# Patient Record
Sex: Male | Born: 1940 | Race: Black or African American | Hispanic: No | Smoking: Never smoker
Health system: Southern US, Community
[De-identification: ages and names within clinical notes are randomized; demographics above are authoritative.]

## PROBLEM LIST (undated history)

## (undated) DIAGNOSIS — N189 Chronic kidney disease, unspecified: Secondary | ICD-10-CM

## (undated) DIAGNOSIS — K3580 Unspecified acute appendicitis: Secondary | ICD-10-CM

## (undated) DIAGNOSIS — K648 Other hemorrhoids: Secondary | ICD-10-CM

## (undated) DIAGNOSIS — K4090 Unilateral inguinal hernia, without obstruction or gangrene, not specified as recurrent: Secondary | ICD-10-CM

## (undated) DIAGNOSIS — C61 Malignant neoplasm of prostate: Secondary | ICD-10-CM

## (undated) DIAGNOSIS — N210 Calculus in bladder: Secondary | ICD-10-CM

## (undated) DIAGNOSIS — I10 Essential (primary) hypertension: Secondary | ICD-10-CM

## (undated) DIAGNOSIS — K5792 Diverticulitis of intestine, part unspecified, without perforation or abscess without bleeding: Secondary | ICD-10-CM

## (undated) DIAGNOSIS — K603 Anal fistula, unspecified: Secondary | ICD-10-CM

## (undated) HISTORY — PX: APPENDECTOMY: SHX54

## (undated) HISTORY — DX: Other hemorrhoids: K64.8

## (undated) HISTORY — DX: Anal fistula, unspecified: K60.30

## (undated) HISTORY — DX: Chronic kidney disease, unspecified: N18.9

## (undated) HISTORY — DX: Anal fistula: K60.3

## (undated) HISTORY — PX: OTHER SURGICAL HISTORY: SHX169

## (undated) HISTORY — DX: Unilateral inguinal hernia, without obstruction or gangrene, not specified as recurrent: K40.90

## (undated) HISTORY — DX: Diverticulitis of intestine, part unspecified, without perforation or abscess without bleeding: K57.92

## (undated) HISTORY — DX: Malignant neoplasm of prostate: C61

## (undated) HISTORY — DX: Unspecified acute appendicitis: K35.80

---

## 2005-02-12 ENCOUNTER — Ambulatory Visit: Payer: Self-pay

## 2007-08-18 ENCOUNTER — Ambulatory Visit: Payer: Self-pay | Admitting: Urology

## 2009-02-14 ENCOUNTER — Ambulatory Visit: Payer: Self-pay | Admitting: Urology

## 2009-02-22 ENCOUNTER — Inpatient Hospital Stay: Payer: Self-pay | Admitting: Surgery

## 2009-03-22 ENCOUNTER — Ambulatory Visit: Payer: Self-pay | Admitting: Surgery

## 2009-05-30 ENCOUNTER — Ambulatory Visit: Payer: Self-pay | Admitting: Gastroenterology

## 2009-08-02 ENCOUNTER — Ambulatory Visit: Payer: Self-pay | Admitting: Surgery

## 2009-08-08 ENCOUNTER — Ambulatory Visit: Payer: Self-pay | Admitting: Surgery

## 2009-08-08 HISTORY — PX: RECTAL SURGERY: SHX760

## 2011-07-03 ENCOUNTER — Inpatient Hospital Stay: Payer: Self-pay | Admitting: Surgery

## 2011-07-03 HISTORY — PX: LAPAROSCOPIC APPENDECTOMY: SHX408

## 2011-07-04 LAB — PATHOLOGY REPORT

## 2011-11-06 HISTORY — PX: INGUINAL HERNIA REPAIR: SUR1180

## 2012-01-28 ENCOUNTER — Ambulatory Visit: Payer: Self-pay | Admitting: Surgery

## 2012-01-28 DIAGNOSIS — I10 Essential (primary) hypertension: Secondary | ICD-10-CM

## 2012-01-28 LAB — BASIC METABOLIC PANEL
Anion Gap: 7 (ref 7–16)
Chloride: 104 mmol/L (ref 98–107)
Co2: 31 mmol/L (ref 21–32)
Creatinine: 1.11 mg/dL (ref 0.60–1.30)
EGFR (African American): >60
Osmolality: 285 (ref 275–301)
Sodium: 142 mmol/L (ref 136–145)

## 2012-01-28 LAB — CBC WITH DIFFERENTIAL/PLATELET
Basophil #: 0 10*3/uL (ref 0.0–0.1)
Basophil %: 0.6 %
HCT: 46.6 % (ref 40.0–52.0)
HGB: 15.7 g/dL (ref 13.0–18.0)
Lymphocyte #: 1.5 10*3/uL (ref 1.0–3.6)
Lymphocyte %: 20.6 %
MCH: 29.7 pg (ref 26.0–34.0)
RDW: 13 % (ref 11.5–14.5)
WBC: 7.4 10*3/uL (ref 3.8–10.6)

## 2012-02-04 ENCOUNTER — Ambulatory Visit: Payer: Self-pay | Admitting: Surgery

## 2012-02-04 HISTORY — PX: HERNIA REPAIR: SHX51

## 2014-08-23 DIAGNOSIS — D126 Benign neoplasm of colon, unspecified: Secondary | ICD-10-CM | POA: Insufficient documentation

## 2014-09-06 ENCOUNTER — Ambulatory Visit: Payer: Self-pay | Admitting: Gastroenterology

## 2015-01-04 ENCOUNTER — Ambulatory Visit: Payer: Self-pay

## 2015-02-27 NOTE — H&P (Signed)
PATIENT NAMELEONTAE, [name] MR#:  [id number] DATE OF BIRTH:  06-14-1941  DATE OF ADMISSION:  02/04/2012  CHIEF COMPLAINT: Right inguinal hernia.   HISTORY OF PRESENT ILLNESS: This is a patient with a right inguinal hernia. He has pain and a bulge. It is requiring surgery for repair. He has been seen in the office recently.   PAST MEDICAL HISTORY:  1. Hypertension. 2. Chronic renal failure.  3. Hypercholesterolemia.  4. Ruptured appendix with autolysis.   PAST SURGICAL HISTORY:  1. Anal fissure. 2. Diagnostic laparoscopy with drainage of abscesses (appendix was never removed, it was autolyzed).   ALLERGIES: Clindamycin.   MEDICATIONS: Hydrochlorothiazide.   FAMILY HISTORY: Noncontributory.   SOCIAL HISTORY: Patient does not smoke or drink. He is semiretired.   REVIEW OF SYSTEMS: 10 system review has been performed and negative with the exception of that mentioned in the history of present illness.   PHYSICAL EXAMINATION:  GENERAL: Healthy-appearing black male patient.   HEENT: No scleral icterus.   NECK: No palpable neck nodes.   CHEST: Clear to auscultation.   CARDIAC: Regular rate and rhythm.   ABDOMEN: Soft, nontender. Multiple scars are noted from prior laparoscopy.   EXTREMITIES: Without edema.   NEUROLOGIC: Grossly intact.   INTEGUMENT: No jaundice.   GENITOURINARY: Right inguinal hernia with normal testicles.   LABORATORY, DIAGNOSTIC AND RADIOLOGICAL DATA: Most recent lab reports are within normal limits including hemoglobin and hematocrit of 15.7 and 47.   ASSESSMENT AND PLAN: This is a patient with a right inguinal hernia. He is here for elective repair using laparoscopic preperitoneal approach. We have discussed with him the options of observation versus an open procedure versus a laparoscopic approach which he has chosen. The risks of bleeding, infection, inability to proceed with a laparoscope, recurrence, ischemic orchitis, chronic pain syndrome, and  neuroma have all been discussed with him. He understood and agreed to proceed.   ____________________________ Jerrol Banana Burt Knack, MD rec:cms D: 02/03/2012 20:29:47 ET T: 02/04/2012 06:32:23 ET JOB#: [id number]  cc: Jerrol Banana. Burt Knack, MD, <Dictator>  Florene Glen MD ELECTRONICALLY SIGNED 02/05/2012 9:55

## 2015-02-27 NOTE — Op Note (Signed)
PATIENT NAME:  [name], [name] MR#:  [id number] DATE OF BIRTH:  [birth year]  DATE OF PROCEDURE:  02/04/2012  PREOPERATIVE DIAGNOSIS: Symptomatic right inguinal hernia.   POSTOPERATIVE DIAGNOSIS: Symptomatic right inguinal hernia.   PROCEDURE: Laparoscopic preperitoneal repair of a right inguinal hernia.   SURGEON: Phoebe Perch, M.D.   ANESTHESIA: General with endotracheal tube.   INDICATIONS: This is a patient with a right inguinal hernia requiring repair. Preoperatively we discussed rationale for surgery, the options of observation, risk of bleeding, infection, recurrence, ischemic orchitis, chronic pain syndrome, and neuroma. This was all reviewed for him in the preop holding area. He understood and agreed to proceed.   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   DESCRIPTION OF PROCEDURE: The patient was induced to general anesthesia, IV antibiotics were given, a Foley catheter was placed, and then he was prepped and draped in a sterile fashion. Marcaine was infiltrated into the skin and subcutaneous tissues around the periumbilical area. An incision was made. Dissection down to the right rectus fascia was performed and it was incised and the muscle retracted laterally. The [place] surgical dissecting balloon was placed followed by the [place] surgical structural balloon, the preperitoneal space was insufflated, and under direct vision two midline 5 mm ports were placed. Nilsa Macht's ligament was delineated. The lateral extent of dissection was determined. At this point, the provided 5 mm sheaths were not staying within the abdominal space; therefore, they were switched out to applied medical ports.   The lateral extent of dissection was again determined. The cord was skeletonized of a large indirect sac, which was retracted cephalad, as was the cord lipoma. The cord lipoma was not removed and into the preperitoneal space was placed a 4 x 6 trimmed and laterally scissored  Atrium mesh, which was placed into the preperitoneal space and held in with the [place] surgical tacker avoiding the area of the nerve. The preperitoneal space was then desufflated under direct vision. There was no sign of rent in the peritoneum and no sign of bowel coming into contact with mesh.   The ports were all removed and then the fascial edges were approximated with 0 Vicryl figure-of-eight sutures. 4-0 subcuticular Monocryl was used at all skin edges. Steri-Strips, Mastisol, and sterile dressings were placed.   The patient tolerated the procedure well. There were no complications. He was taken to the recovery room in stable condition to be discharged in the care of his family with followup in 10 days. ____________________________ Jerrol Banana Burt Knack, MD rec:slb D: 02/04/2012 09:50:44 ET T: 02/04/2012 11:21:03 ET JOB#: [id number]  cc: Jerrol Banana. Burt Knack, MD, <Dictator> Florene Glen MD ELECTRONICALLY SIGNED 02/05/2012 9:55

## 2015-02-28 LAB — SURGICAL PATHOLOGY

## 2015-05-24 ENCOUNTER — Encounter: Payer: Self-pay | Admitting: Surgery

## 2015-05-24 ENCOUNTER — Ambulatory Visit (INDEPENDENT_AMBULATORY_CARE_PROVIDER_SITE_OTHER): Payer: Medicare Other | Admitting: Surgery

## 2015-05-24 ENCOUNTER — Encounter (INDEPENDENT_AMBULATORY_CARE_PROVIDER_SITE_OTHER): Payer: Self-pay

## 2015-05-24 VITALS — BP 144/74 | HR 80 | Temp 97.9°F | Ht 68.0 in | Wt 175.0 lb

## 2015-05-24 DIAGNOSIS — K439 Ventral hernia without obstruction or gangrene: Secondary | ICD-10-CM

## 2015-05-24 HISTORY — DX: Ventral hernia without obstruction or gangrene: K43.9

## 2015-05-24 NOTE — Progress Notes (Signed)
Patient ID: [name], male   DOB: 1940/12/08, 74 y.o.   MRN: [id number]  Chief Complaint  Patient presents with  . Umbilical Hernia     x 3 years, nonsymptomatic    HPI   [name] is a 73 y.o. male.  With a prior history of a laparoscopic right inguinal herniorrhaphy with mesh. 2 years ago he noted an umbilical bulge which is constant and very sporadic discomfort. He does not think that it is getting bigger. He self-referred himself to the office to have this evaluated. The patient denies any nausea vomiting or any recurrence in the right groin. Denies any right groin pain.  HPI  Past Medical History  Diagnosis Date  . Hemorrhoids, internal   . Acute appendicitis     perforated  . Inguinal hernia   . Diverticulitis   . Anal fistula   . Chronic kidney disease     Past Surgical History  Procedure Laterality Date  . Laparoscopic appendectomy  07/03/2011    Dr Burt Knack  . Hernia repair  02/04/2012    Dr. Burt Knack  . Rectal surgery  08/08/2009    Dr. Pat Patrick  . Colonoscopy 2010    . Inguinal hernia repair  2013    Dr. Burt Knack    Family History  Problem Relation Age of Onset  . Hypertension Mother   . Cancer Father   . Hypertension Father     Social History History  Substance Use Topics  . Smoking status: Never Smoker   . Smokeless tobacco: Never Used  . Alcohol Use: No    Allergies  Allergen Reactions  . Clindamycin/Lincomycin Rash    Tachycardia     Current Outpatient Prescriptions  Medication Sig Dispense Refill  . dutasteride (AVODART) 0.5 MG capsule Take 1 tablet by mouth at bedtime.    . hydrochlorothiazide (HYDRODIURIL) 25 MG tablet Take 1 tablet by mouth at bedtime.  2  . lovastatin (MEVACOR) 40 MG tablet Take 80 mg by mouth at bedtime.      No current facility-administered medications for this visit.      Review of Systems A 10 point review of systems was asked and was negative except for the following positive findings.  Blood pressure  144/74, pulse 80, temperature 97.9 F (36.6 C), temperature source Oral, height 5\' 8"  (1.727 m), weight 175 lb (79.379 kg).  Physical Exam CONSTITUTIONAL:  Pleasant, well-developed, well-nourished, and in no acute distress. EYES: Pupils equal and reactive to light, Sclera non-icteric EARS, NOSE, MOUTH AND THROAT:  The oropharynx was clear.  Dentition is good repair.  Oral mucosa pink and moist. LYMPH NODES:  Lymph nodes in the neck and axillae were normal RESPIRATORY:  Lungs were clear.  Normal respiratory effort without pathologic use of accessory muscles of respiration CARDIOVASCULAR: Heart was regular without murmurs.  There were no carotid bruits. GI: The abdomen was soft, nontender, and nondistended. A small reducible supraumbilical hernia is noted. There is a scar consistent with prior TEP hernia repair. GU:  Rectal deferred.   MUSCULOSKELETAL:  Normal muscle strength and tone.  No clubbing or cyanosis.   SKIN:  There were no pathologic skin lesions.  There were no nodules on palpation. NEUROLOGIC:  Sensation is normal.  Cranial nerves are grossly intact. PSYCH:  Oriented to person, place and time.  Mood and affect are normal.  Assessment    Minimally symptomatic umbilical hernia.    Plan    At present I do not think repair is indicated. The  patient is in agreement. I will see him back in the office as needed.       Sherri Rad 05/24/2015, 9:56 AM

## 2015-05-24 NOTE — Patient Instructions (Signed)
Follow-up in our office as needed.   Please call with any questions or concerns.  Call if you begin to have symptoms more frequently or if they become much worse.

## 2015-06-06 ENCOUNTER — Telehealth: Payer: Self-pay | Admitting: Urology

## 2015-06-06 NOTE — Telephone Encounter (Signed)
-----   Message from Otilio Jefferson sent at 05/16/2015  3:02 PM EDT ----- Regarding: Well Visit may be needed Sent: 06/06/2015  Last Encounter: Office visit, 01/10/2015  Reminder for patient: [name]  call pt. to schedule 6 month f/u

## 2015-06-06 NOTE — Telephone Encounter (Signed)
Please call the patient to schedule a 6 month follow up for this patient.  He will need a PSA drawn before the appointment.  He will need an IPSS and PVR at the time of his appointment.

## 2015-06-09 NOTE — Telephone Encounter (Signed)
I have made him two appts the first is for labs 12-12-15 @ 8:30 And then 12-15-15 @ 3:30 to see you I have left him a message to call me back to give him these appts with his wife.  Thanks, [name]

## 2015-06-10 ENCOUNTER — Telehealth: Payer: Self-pay | Admitting: Urology

## 2015-06-10 NOTE — Telephone Encounter (Signed)
Pt called to let us know he would call back to reschedule at a later date due to other appts.

## 2015-07-19 ENCOUNTER — Encounter: Payer: Self-pay | Admitting: Urology

## 2015-07-19 ENCOUNTER — Ambulatory Visit (INDEPENDENT_AMBULATORY_CARE_PROVIDER_SITE_OTHER): Payer: Medicare Other | Admitting: Urology

## 2015-07-19 VITALS — BP 122/71 | HR 79 | Ht 68.0 in | Wt 186.4 lb

## 2015-07-19 DIAGNOSIS — N138 Other obstructive and reflux uropathy: Secondary | ICD-10-CM

## 2015-07-19 DIAGNOSIS — R972 Elevated prostate specific antigen [PSA]: Secondary | ICD-10-CM

## 2015-07-19 DIAGNOSIS — N433 Hydrocele, unspecified: Secondary | ICD-10-CM | POA: Diagnosis not present

## 2015-07-19 DIAGNOSIS — N401 Enlarged prostate with lower urinary tract symptoms: Secondary | ICD-10-CM | POA: Diagnosis not present

## 2015-07-19 HISTORY — DX: Elevated prostate specific antigen (PSA): R97.20

## 2015-07-19 HISTORY — DX: Hydrocele, unspecified: N43.3

## 2015-07-19 LAB — BLADDER SCAN AMB NON-IMAGING: Scan Result: 0

## 2015-07-19 MED ORDER — CIPROFLOXACIN HCL 500 MG PO TABS: 500.0000 mg | ORAL_TABLET | Freq: Two times a day (BID) | ORAL | Status: DC

## 2015-07-19 MED ORDER — DUTASTERIDE 0.5 MG PO CAPS: 0.5000 mg | ORAL_CAPSULE | Freq: Every day | ORAL | Status: DC

## 2015-07-19 NOTE — Progress Notes (Signed)
07/19/2015 11:55 AM   [name] Feb 15, 1941 [id number]  Referring provider: Marguerita Merles, MD [address], [place] [address]  Chief Complaint  Patient presents with  . Elevated PSA    follow up  . Benign Prostatic Hypertrophy    HPI: Patient is a 74 year old African American male with a history of elevated PSA, hydroceles and BPH with L UTS who presents today for 6 month follow-up visit.  Elevated PSA Patient has a history of elevated PSA's who has had two negative biopsies in the past.  He was started on dutasteride in April 2014 when his PSA was returned 6.8 ng/mL. His most recent PSA was 4.1 ng/mL on 06/30/2015. He still continues to take the dutasteride.  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  He has not noticed any change in size of the scrotum or is experiencing any discomfort.  BPH WITH LUTS His IPSS score today is 3, which is mild lower urinary tract symptomatology. He is mostly satisfied with his quality life due to his urinary symptoms. His PVR is 0 mL.  He has no current bothersome urinary symptoms at this time.  In the past, he had been experiencing incomplete empyting and nocturia.  He had these symptoms for many years.  His symptoms have been controlled on dutasteride.  He denies any dysuria, hematuria or suprapubic pain.    He has had two negative biopsies.  He also denies any recent fevers, chills, nausea or vomiting.  He has a family history of PCa, with his father who was diagnosed with prostate cancer.         IPSS      07/19/15 0900       International Prostate Symptom Score   How often have you had the sensation of not emptying your bladder? Less than half the time     How often have you had to urinate less than every two hours? Not at All     How often have you found you stopped and started again several times when you urinated? Not at All     How often have you  found it difficult to postpone urination? Less than 1 in 5 times     How often have you had a weak urinary stream? Not at All     How often have you had to strain to start urination? Not at All     How many times did you typically get up at night to urinate? None     Total IPSS Score 3     Quality of Life due to urinary symptoms   If you were to spend the rest of your life with your urinary condition just the way it is now how would you feel about that? Mostly Satisfied        Score:  1-7 Mild 8-19 Moderate 20-35 Severe  PMH: Past Medical History  Diagnosis Date  . Hemorrhoids, internal   . Acute appendicitis     perforated  . Inguinal hernia   . Diverticulitis   . Anal fistula   . Chronic kidney disease     Surgical History: Past Surgical History  Procedure Laterality Date  . Laparoscopic appendectomy  07/03/2011    Dr Burt Knack  . Hernia repair  02/04/2012    Dr. Burt Knack  . Rectal surgery  08/08/2009    Dr. Pat Patrick  . Colonoscopy 2010    . Inguinal hernia repair  2013    Dr. Burt Knack    Home Medications:    Medication List       This list is accurate as of: 07/19/15 11:55 AM.  Always use your most recent med list.               ciprofloxacin 500 MG tablet  Commonly known as:  CIPRO  Take 1 tablet (500 mg total) by mouth every 12 (twelve) hours.     dutasteride 0.5 MG capsule  Commonly known as:  AVODART  Take 1 capsule (0.5 mg total) by mouth at bedtime.     hydrochlorothiazide 25 MG tablet  Commonly known as:  HYDRODIURIL  Take 1 tablet by mouth at bedtime.     lovastatin 40 MG tablet  Commonly known as:  MEVACOR  Take 80 mg by mouth at bedtime.        Allergies:  Allergies  Allergen Reactions  . Clindamycin/Lincomycin Rash    Tachycardia     Family History: Family History  Problem Relation Age of Onset  . Hypertension Mother   . Cancer Father     prostate  . Hypertension Father   . Kidney disease Neg Hx     Social History:  reports that  he has never smoked. He has never used smokeless tobacco. He reports that he does not drink alcohol or use illicit drugs.  ROS: UROLOGY Frequent Urination?: No Hard to postpone urination?: No Burning/pain with urination?: No Get up at night to urinate?: No Leakage of urine?: No Urine stream starts and stops?: No Trouble starting stream?: No Do you have to strain to urinate?: No Blood in urine?: No Urinary tract infection?: No Sexually transmitted disease?: No Injury to kidneys or bladder?: No Painful intercourse?: No Weak stream?: No Erection problems?: No Penile pain?: No  Gastrointestinal Nausea?: No Vomiting?: No Indigestion/heartburn?: No Diarrhea?: No Constipation?: No  Constitutional Fever: No Night sweats?: No Weight loss?: No Fatigue?: No  Skin Skin rash/lesions?: No Itching?: No  Eyes Blurred vision?: No Double vision?: No  Ears/Nose/Throat Sore throat?: No Sinus problems?: No  Hematologic/Lymphatic Swollen glands?: No Easy bruising?: No  Cardiovascular Leg swelling?: No Chest pain?: No  Respiratory Cough?: No Shortness of breath?: No  Endocrine Excessive thirst?: No  Musculoskeletal Back pain?: Yes Joint pain?: No  Neurological Headaches?: No Dizziness?: No  Psychologic Depression?: No Anxiety?: No  Physical Exam: BP 122/71 mmHg  Pulse 79  Ht 5\' 8"  (1.727 m)  Wt 186 lb 6.4 oz (84.55 kg)  BMI 28.35 kg/m2  GU: Patient with circumcised phallus.  Urethral meatus is patent.  No penile discharge. No penile lesions or rashes. Scrotum without lesions, cysts, rashes and/or edema.  Testicles are located scrotally bilaterally. No masses are appreciated in the testicles. Left and right epididymis are normal. Rectal: Patient with  normal sphincter tone. Perineum without scarring or rashes. No rectal masses are appreciated. Prostate is approximately 45 grams, no nodules are appreciated. Seminal vesicles are normal.   Laboratory Data: Lab  Results  Component Value Date   WBC 7.4 01/28/2012   HGB 15.7 01/28/2012   HCT 46.6 01/28/2012   MCV 88 01/28/2012   PLT 236 01/28/2012    Lab Results  Component Value Date   CREATININE 1.11 01/28/2012   Creatinine   1.45 on 06/30/2015  PSA History:  6.8 ng/mL on 03/02/2013-patient started on dutasteride  3.5 ng/mL on 07/20/2013  4.3 ng/mL on 06/07/2014    4.4 ng/mL on 12/27/2014    4.1  ng/mL on 06/30/2015   Pertinent Imaging: Results for LEMAR, BAKOS (MRN [id number]) as of 07/19/2015 09:32  Ref. Range 07/19/2015 09:26  Scan Result Unknown 0    Assessment & Plan:    1. Elevated PSA:   Patient has a history of elevated PSA's who has had two negative biopsies in the past.  He was started on dutasteride in April 2014 when his PSA was returned 6.8 ng/mL. His most recent PSA was 4.1 ng/mL on 06/30/2015. He still continues to take the dutasteride.  He will RTC in 6 months for DRE and IPSS score.  2. BPH (benign prostatic hyperplasia) with LUTS:   IPSS score is 3/2.  His PVR is 0 mL.  He will continue the dutasteride.  I have sent a refill for this medication to his pharmacy.  He will return in 6 months time for IPS S score and DRE.  - BLADDER SCAN AMB NON-IMAGING  3. Bilateral hydroceles:   Patient has moderate bilateral hydroceles as confirmed by scrotal ultrasound performed in March 2016. They are not bothersome to him at this time. We will continue conservative management. We will readdress when he returns in 6 months.   Return in about 6 months (around 01/16/2016) for IPSS and PVR.  Zara Council, [facility] [address] [place], [place] [address] [phone]

## 2015-12-12 ENCOUNTER — Other Ambulatory Visit: Payer: BLUE CROSS/BLUE SHIELD

## 2015-12-15 ENCOUNTER — Ambulatory Visit: Payer: BLUE CROSS/BLUE SHIELD | Admitting: Urology

## 2016-01-06 ENCOUNTER — Other Ambulatory Visit: Payer: Medicare Other

## 2016-01-09 ENCOUNTER — Other Ambulatory Visit: Payer: Medicare Other

## 2016-01-09 ENCOUNTER — Ambulatory Visit: Payer: Medicare Other | Admitting: Urology

## 2016-08-14 ENCOUNTER — Encounter: Payer: Self-pay | Admitting: Urology

## 2016-08-14 ENCOUNTER — Ambulatory Visit (INDEPENDENT_AMBULATORY_CARE_PROVIDER_SITE_OTHER): Payer: Medicare Other | Admitting: Urology

## 2016-08-14 VITALS — BP 157/93 | HR 71 | Ht 68.0 in | Wt 185.5 lb

## 2016-08-14 DIAGNOSIS — Z87898 Personal history of other specified conditions: Secondary | ICD-10-CM | POA: Diagnosis not present

## 2016-08-14 DIAGNOSIS — N401 Enlarged prostate with lower urinary tract symptoms: Secondary | ICD-10-CM | POA: Diagnosis not present

## 2016-08-14 DIAGNOSIS — N138 Other obstructive and reflux uropathy: Secondary | ICD-10-CM | POA: Diagnosis not present

## 2016-08-14 DIAGNOSIS — N411 Chronic prostatitis: Secondary | ICD-10-CM | POA: Diagnosis not present

## 2016-08-14 DIAGNOSIS — N400 Enlarged prostate without lower urinary tract symptoms: Secondary | ICD-10-CM

## 2016-08-14 DIAGNOSIS — N433 Hydrocele, unspecified: Secondary | ICD-10-CM | POA: Diagnosis not present

## 2016-08-14 LAB — BLADDER SCAN AMB NON-IMAGING: SCAN RESULT: 0

## 2016-08-14 MED ORDER — DUTASTERIDE 0.5 MG PO CAPS
0.5000 mg | ORAL_CAPSULE | Freq: Every day | ORAL | 3 refills | Status: DC
Start: 2016-08-14 — End: 2017-08-14

## 2016-08-14 MED ORDER — CIPROFLOXACIN HCL 500 MG PO TABS: 500.0000 mg | ORAL_TABLET | Freq: Two times a day (BID) | ORAL | 0 refills | Status: DC

## 2016-08-14 NOTE — Progress Notes (Signed)
08/14/2016 4:23 PM   [name] [birth year] [id number]  Referring provider: Marguerita Merles, MD [place], [place] [address]  Chief Complaint  Patient presents with  . Benign Prostatic Hypertrophy    6 month follow up   . Elevated PSA    HPI: Patient is a 75 year old African American male with a history of elevated PSA, hydroceles and BPH with LUTS who presents today for 6 month follow-up visit.  History of elevated PSA Patient has a history of elevated PSA's who has had two negative biopsies in the past.  He was started on dutasteride in April 2014 when his PSA was returned 6.8 ng/mL. His most recent PSA was 4.1 ng/mL on 06/30/2015.   He still continues to take the dutasteride.   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   He has not noticed any change in size of the scrotum or is experiencing any discomfort.  BPH WITH LUTS His IPSS score today is 7, which is mild lower urinary tract symptomatology. He is mixed with his quality life due to his urinary symptoms. His PVR is 0 mL.  His previous IPSS score was 3/2.  His PVR previous was 0 mL.  He has been experiencing incomplete emptying, urgency and nocturia.  He had these symptoms for many years.  His symptoms have been controlled on dutasteride.  He denies any dysuria, hematuria or suprapubic pain.    He has had two negative biopsies.  He also denies any recent fevers, chills, nausea or vomiting.  He has a family history of PCa, with his father who was diagnosed with prostate cancer.        IPSS    Row Name 08/14/16 1500         International Prostate Symptom Score   How often have you had the sensation of not emptying your bladder? Less than half the time     How often have you had to urinate less than every two hours? Less than 1 in 5 times     How often have you found you stopped and started again several times when you urinated? Not at All      How often have you found it difficult to postpone urination? Less than half the time     How often have you had a weak urinary stream? Not at All     How often have you had to strain to start urination? Not at All     How many times did you typically get up at night to urinate? 2 Times     Total IPSS Score 7       Quality of Life due to urinary symptoms   If you were to spend the rest of your life with your urinary condition just the way it is now how would you feel about that? Mixed        Score:  1-7 Mild 8-19 Moderate 20-35 Severe  PMH: Past Medical History:  Diagnosis Date  . Acute appendicitis    perforated  . Anal fistula   . Chronic kidney disease   . Diverticulitis   . Hemorrhoids, internal   . Inguinal hernia     Surgical History: Past Surgical History:  Procedure Laterality Date  . colonoscopy 2010    . HERNIA REPAIR  02/04/2012   Dr. Burt Knack  . INGUINAL HERNIA REPAIR  2013   Dr. Burt Knack  . LAPAROSCOPIC APPENDECTOMY  07/03/2011   Dr Burt Knack  . RECTAL SURGERY  08/08/2009   Dr. Pat Patrick    Home Medications:    Medication List       Accurate as of 08/14/16  4:23 PM. Always use your most recent med list.          ciprofloxacin 500 MG tablet Commonly known as:  CIPRO Take 1 tablet (500 mg total) by mouth every 12 (twelve) hours.   dutasteride 0.5 MG capsule Commonly known as:  AVODART Take 1 capsule (0.5 mg total) by mouth at bedtime.   hydrochlorothiazide 25 MG tablet Commonly known as:  HYDRODIURIL Take 1 tablet by mouth at bedtime.   lovastatin 40 MG tablet Commonly known as:  MEVACOR Take 80 mg by mouth at bedtime.   polyethylene glycol powder powder Commonly known as:  GLYCOLAX/MIRALAX As directed for colonic prep.       Allergies:  Allergies  Allergen Reactions  . Clindamycin/Lincomycin Rash    Tachycardia     Family History: Family History  Problem Relation Age of Onset  . Hypertension Mother   . Cancer Father      prostate  . Hypertension Father   . Kidney disease Neg Hx     Social History:  reports that he has never smoked. He has never used smokeless tobacco. He reports that he does not drink alcohol or use drugs.  ROS: UROLOGY Frequent Urination?: No Hard to postpone urination?: No Burning/pain with urination?: No Get up at night to urinate?: No Leakage of urine?: No Urine stream starts and stops?: No Trouble starting stream?: No Do you have to strain to urinate?: No Blood in urine?: No Urinary tract infection?: No Sexually transmitted disease?: No Injury to kidneys or bladder?: No Painful intercourse?: No Weak stream?: No Erection problems?: No Penile pain?: No  Gastrointestinal Nausea?: No Vomiting?: No Indigestion/heartburn?: No Diarrhea?: No Constipation?: No  Constitutional Fever: No Night sweats?: No Weight loss?: No Fatigue?: No  Skin Skin rash/lesions?: No Itching?: No  Eyes Blurred vision?: No Double vision?: No  Ears/Nose/Throat Sore throat?: No Sinus problems?: No  Hematologic/Lymphatic Swollen glands?: No Easy bruising?: No  Cardiovascular Leg swelling?: No Chest pain?: No  Respiratory Cough?: No Shortness of breath?: No  Endocrine Excessive thirst?: No  Musculoskeletal Back pain?: No Joint pain?: No  Neurological Headaches?: No Dizziness?: No  Psychologic Depression?: No Anxiety?: No  Physical Exam: BP (!) 157/93   Pulse 71   Ht 5\' 8"  (1.727 m)   Wt 185 lb 8 oz (84.1 kg)   BMI 28.21 kg/m   GU: Patient with circumcised phallus.  Urethral meatus is patent.  No penile discharge. No penile lesions or rashes. Scrotum without lesions, cysts, rashes and/or edema.  Testicles are located scrotally bilaterally. No masses are appreciated in the testicles. Left and right epididymis are normal. Rectal: Patient with  normal sphincter tone. Perineum without scarring or rashes. No rectal masses are appreciated. Prostate is approximately 45  grams, no nodules are appreciated. Seminal vesicles are normal.   Laboratory Data: Lab Results  Component Value Date   WBC 7.4 01/28/2012   HGB 15.7 01/28/2012   HCT 46.6 01/28/2012   MCV 88 01/28/2012   PLT 236 01/28/2012    Lab Results  Component Value Date   CREATININE 1.11 01/28/2012   Creatinine   1.45 on 06/30/2015  PSA History:  6.8 ng/mL on 03/02/2013-patient started on dutasteride  3.5 ng/mL on 07/20/2013  4.3 ng/mL on 06/07/2014    4.4 ng/mL  on 12/27/2014    4.1 ng/mL on 06/30/2015   Pertinent Imaging: Results for HENSLEY, EBERL (MRN [id number]) as of 08/21/2016 20:54  Ref. Range 08/14/2016 16:09  Scan Result Unknown 0     Assessment & Plan:    1. History of elevated PSA  - Patient has a history of elevated PSA's who has had two negative biopsies in the past  - He was started on dutasteride in April 2014  - His most recent PSA was 4.1 ng/mL on 06/30/2015  - PSA screening has been discontinued due to age and co-morbidities  - He will RTC in 6 months for DRE and IPSS score.  2. BPH with LUTS  - IPSS score is 7/3, it is worsening  - Continue conservative management, avoiding bladder irritants and timed voiding's  - Continue dutasteride 0.5 mg daily; refill given  - RTC in 6 months for IPSS and exam   - BLADDER SCAN AMB NON-IMAGING  3. Bilateral hydroceles  - Patient has bilateral hydroceles as confirmed by scrotal ultrasound performed in March 2016  - They are not bothersome to him at this time  - We will continue conservative management  - We will readdress when he returns in 6 months  4. Prostatitis  - Patient has symptoms of prostatitis for which he takes Cipro  - Patient and I had the discussion concerning antibiotic stewardship and it is not recommended to take antibiotics without documented infections  - Patient voiced his understanding of my concerns and is understanding of the risks, but he persisted on having the Cipro on hand and  became quiet anxious when he considered not having the prescription  - Script given for Cipro   Return in about 1 year (around 08/14/2017) for IPSS and exam.  Zara Council, [facility]  [facility] [address], [place], [place] [address] [phone]

## 2016-08-17 ENCOUNTER — Telehealth: Payer: Self-pay

## 2016-08-17 NOTE — Telephone Encounter (Signed)
Spoke with pt in reference to needing abx sent to pharmacy. Pt stated that he uses [facility] in [place]. [place] and spoke with [place]. Dana Allan a VO of cipro 500mg  bid x30. Lytle Michaels voiced understanding. Spoke with pt again and made aware VO was given to [place] for abx. Pt voiced understanding.

## 2017-05-28 ENCOUNTER — Emergency Department (HOSPITAL_COMMUNITY)
Admission: EM | Admit: 2017-05-28 | Discharge: 2017-05-28 | Disposition: A | Discharge status: Home / Self Care | Payer: Medicare Other | Attending: Emergency Medicine | Admitting: Emergency Medicine

## 2017-05-28 ENCOUNTER — Encounter (HOSPITAL_COMMUNITY): Payer: Self-pay | Admitting: Emergency Medicine

## 2017-05-28 DIAGNOSIS — Z79899 Other long term (current) drug therapy: Secondary | ICD-10-CM | POA: Diagnosis not present

## 2017-05-28 DIAGNOSIS — N189 Chronic kidney disease, unspecified: Secondary | ICD-10-CM | POA: Diagnosis not present

## 2017-05-28 DIAGNOSIS — R1084 Generalized abdominal pain: Secondary | ICD-10-CM | POA: Insufficient documentation

## 2017-05-28 DIAGNOSIS — R109 Unspecified abdominal pain: Secondary | ICD-10-CM | POA: Diagnosis present

## 2017-05-28 NOTE — ED Triage Notes (Signed)
Pt states he went to urgent care this am. They check his urine, was told it was normal. Told to come to ED to have abdomen evaluated

## 2017-05-28 NOTE — ED Triage Notes (Signed)
Pt states pain was sharp yesterday, later had a BM and pain has been better, no pain today

## 2017-05-28 NOTE — Discharge Instructions (Signed)
Your evaluation today, is reassuring.  There did not appear to be any signs for diverticulitis at this time.  It is safe to take Tylenol if needed for pain.  Start with a bland, and a low fiber diet, for 1 or 2 days, and make sure you are drinking plenty of fluids.  Return here if needed for worsening pain, weakness, dizziness, or other concerns.

## 2017-05-28 NOTE — ED Triage Notes (Signed)
Started lower abdominal pain yesterday radiating into low back, denies N/V/D.

## 2017-05-28 NOTE — ED Provider Notes (Signed)
[facility] DEPT Provider Note   CSN: [id number] Arrival date & time: 05/28/17  6789     History   Chief Complaint Chief Complaint  Patient presents with  . Abdominal Pain    HPI [name] is a 76 y.o. male.  He presents for evaluation of intermittent abdominal pain which started 4 days ago.  Initially was brief, then returned yesterday and was present most of the day.  It felt like a cramping sensation.  Because of the pain he went to see a primary care doctor today, because he thought he had a urinary tract infection.  A urinalysis was apparently done which was normal.  Because there was no clear cause of the pain he was sent here for evaluation and possible CT imaging.  The patient denies pain today.  He denies nausea, vomiting, diarrhea, fever, chills, cough, chest pain, weakness or dizziness.  He has a history of "diverticulitis."  There are no other known modifying factors.  HPI  Past Medical History:  Diagnosis Date  . Acute appendicitis    perforated  . Anal fistula   . Chronic kidney disease   . Diverticulitis   . Hemorrhoids, internal   . Inguinal hernia     Patient Active Problem List   Diagnosis Date Noted  . BPH with obstruction/lower urinary tract symptoms 07/19/2015  . Elevated PSA 07/19/2015  . Hydrocele, bilateral 07/19/2015  . Ventral hernia without obstruction or gangrene 05/24/2015  . Adenomatous polyp of colon 08/23/2014    Past Surgical History:  Procedure Laterality Date  . colonoscopy 2010    . HERNIA REPAIR  02/04/2012   Dr. Burt Knack  . INGUINAL HERNIA REPAIR  2013   Dr. Burt Knack  . LAPAROSCOPIC APPENDECTOMY  07/03/2011   Dr Burt Knack  . RECTAL SURGERY  08/08/2009   Dr. Pat Patrick       Home Medications    Prior to Admission medications   Medication Sig Start Date End Date Taking? Authorizing Provider  ciprofloxacin (CIPRO) 500 MG tablet Take 1 tablet (500 mg total) by mouth every 12 (twelve) hours. 08/14/16   Zara Council A, PA-C    dutasteride (AVODART) 0.5 MG capsule Take 1 capsule (0.5 mg total) by mouth at bedtime. 08/14/16   Zara Council A, PA-C  hydrochlorothiazide (HYDRODIURIL) 25 MG tablet Take 1 tablet by mouth at bedtime. 05/03/15   [provider]  lovastatin (MEVACOR) 40 MG tablet Take 80 mg by mouth at bedtime.     [provider]  polyethylene glycol powder (GLYCOLAX/MIRALAX) powder As directed for colonic prep. 08/23/14   [provider]    Family History Family History  Problem Relation Age of Onset  . Hypertension Mother   . Cancer Father        prostate  . Hypertension Father   . Kidney disease Neg Hx     Social History Social History  Substance Use Topics  . Smoking status: Never Smoker  . Smokeless tobacco: Never Used  . Alcohol use No     Allergies   Clindamycin/lincomycin   Review of Systems Review of Systems  All other systems reviewed and are negative.    Physical Exam Updated Vital Signs BP 139/88   Pulse 72   Temp 97.7 F (36.5 C) (Oral)   Resp 20   Ht 5\' 7"  (1.702 m)   Wt 83 kg (183 lb)   SpO2 98%   BMI 28.66 kg/m   Physical Exam  Constitutional: He is oriented to person, place,  and time. He appears well-developed and well-nourished. No distress.  HENT:  Head: Normocephalic and atraumatic.  Right Ear: External ear normal.  Left Ear: External ear normal.  Eyes: Pupils are equal, round, and reactive to light. Conjunctivae and EOM are normal.  Neck: Normal range of motion and phonation normal. Neck supple.  Cardiovascular: Normal rate, regular rhythm and normal heart sounds.   Pulmonary/Chest: Effort normal and breath sounds normal. He exhibits no bony tenderness.  Abdominal: Soft. He exhibits no distension and no mass. There is no tenderness. There is no rebound and no guarding.  Musculoskeletal: Normal range of motion.  Neurological: He is alert and oriented to person, place, and time. No cranial nerve deficit or sensory  deficit. He exhibits normal muscle tone. Coordination normal.  Skin: Skin is warm, dry and intact.  Psychiatric: He has a normal mood and affect. His behavior is normal. Judgment and thought content normal.  Nursing note and vitals reviewed.    ED Treatments / Results  Labs (all labs ordered are listed, but only abnormal results are displayed) Labs Reviewed - No data to display  EKG  EKG Interpretation None       Radiology No results found.  Procedures Procedures (including critical care time)  Medications Ordered in ED Medications - No data to display   Initial Impression / Assessment and Plan / ED Course  I have reviewed the triage vital signs and the nursing notes.  Pertinent labs & imaging results that were available during my care of the patient were reviewed by me and considered in my medical decision making (see chart for details).      Patient Vitals for the past 24 hrs:  BP Temp Temp src Pulse Resp SpO2 Height Weight  05/28/17 1006 139/88 97.7 F (36.5 C) Oral 72 20 98 % - -  05/28/17 1004 - - - - - - 5\' 7"  (1.702 m) 83 kg (183 lb)    10:31 AM Reevaluation with update and discussion. After initial assessment and treatment, an updated evaluation reveals clinical examination is unchanged.  Patient states that he does not have any pain, and is comfortable going home with watchful observation.  Findings discussed with the patient and all questions answered. [name]    Final Clinical Impressions(s) / ED Diagnoses   Final diagnoses:  Generalized abdominal pain   Nonspecific intermittent abdominal pain with a history of diverticulitis.  Patient does not have any clinical findings for diverticulitis, besides abdominal pain.  Therefore the patient can be observed, until such time as he requires additional evaluation.  Doubt serious bacterial infection, metabolic instability or impending vascular collapse.  Nursing Notes Reviewed/ Care  Coordinated Applicable Imaging Reviewed Interpretation of Laboratory Data incorporated into ED treatment  The patient appears reasonably screened and/or stabilized for discharge and I doubt any other medical condition or other [facility] requiring further screening, evaluation, or treatment in the ED at this time prior to discharge.  Plan: Home Medications-continue usual medications; Home Treatments-drink plenty of fluids and gradually advance diet; return here if the recommended treatment, does not improve the symptoms; Recommended follow up-PCP, or return here as needed.   New Prescriptions New Prescriptions   No medications on file     Daleen Bo, MD 05/28/17 1034

## 2017-08-13 NOTE — Progress Notes (Signed)
08/14/2017 11:07 AM   [name] Dec 08, 1940 [id number]  Referring provider: Marguerita Merles, [place],  [id number]  Chief Complaint  Patient presents with  . Benign Prostatic Hypertrophy    1 year follow up  . Elevated PSA    HPI: Patient is a 76 year old African American male with a history of elevated PSA, hydroceles and BPH with LUTS who presents today for 12 month follow-up visit.  History of elevated PSA Patient has a history of elevated PSA's who has had two negative biopsies in the past.  He was started on dutasteride in April 2014 when his PSA was returned 6.8 ng/mL. His most recent PSA was 6.0 ng/mL on 03/13/2017.   He still continues to take the dutasteride.   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   He has not noticed any change in size of the scrotum or is experiencing any discomfort.  BPH WITH LUTS His IPSS score today is 6, which is mild lower urinary tract symptomatology.   He is mostly satisfied with his quality life due to his urinary symptoms. His PVR is 0 mL.  His previous IPSS score was 7/3.   His PVR previous was 0 mL.  He has been experiencing nocturia x 2.    He had these symptoms for many years.  His symptoms have been controlled on dutasteride.  He denies any dysuria, hematuria or suprapubic pain.    He has had two negative biopsies.  He also denies any recent fevers, chills, nausea or vomiting.  He has a family history of PCa, with his father who was diagnosed with prostate cancer.        IPSS    Row Name 08/14/17 1000         International Prostate Symptom Score   How often have you had the sensation of not emptying your bladder? Not at All     How often have you had to urinate less than every two hours? Not at All     How often have you found you stopped and started again several times when you urinated? Not at All     How often have you found it  difficult to postpone urination? Less than half the time     How often have you had a weak urinary stream? Less than half the time     How often have you had to strain to start urination? Not at All     How many times did you typically get up at night to urinate? 2 Times     Total IPSS Score 6       Quality of Life due to urinary symptoms   If you were to spend the rest of your life with your urinary condition just the way it is now how would you feel about that? Mostly Satisfied        Score:  1-7 Mild 8-19 Moderate 20-35 Severe  PMH: Past Medical History:  Diagnosis Date  . Acute appendicitis    perforated  . Anal fistula   . Chronic kidney disease   . Diverticulitis   . Hemorrhoids, internal   . Inguinal hernia     Surgical History: Past Surgical History:  Procedure Laterality Date  . colonoscopy 2010    . HERNIA REPAIR  02/04/2012   Dr. Burt Knack  . INGUINAL HERNIA REPAIR  2013   Dr. Burt Knack  . LAPAROSCOPIC APPENDECTOMY  07/03/2011   Dr Burt Knack  . RECTAL SURGERY  08/08/2009   Dr. Pat Patrick    Home Medications:  Allergies as of 08/14/2017      Reactions   Clindamycin/lincomycin Rash   Tachycardia      Medication List       Accurate as of 08/14/17 11:07 AM. Always use your most recent med list.          amoxicillin-clavulanate 875-125 MG tablet Commonly known as:  AUGMENTIN Take 1 tablet by mouth every 12 (twelve) hours.   ciprofloxacin 500 MG tablet Commonly known as:  CIPRO Take 1 tablet (500 mg total) by mouth every 12 (twelve) hours.   dutasteride 0.5 MG capsule Commonly known as:  AVODART Take 1 capsule (0.5 mg total) by mouth at bedtime.   hydrochlorothiazide 25 MG tablet Commonly known as:  HYDRODIURIL Take 1 tablet by mouth at bedtime.   lovastatin 40 MG tablet Commonly known as:  MEVACOR Take 80 mg by mouth at bedtime.   polyethylene glycol powder powder Commonly known as:  GLYCOLAX/MIRALAX As directed for colonic prep.        Allergies:  Allergies  Allergen Reactions  . Clindamycin/Lincomycin Rash    Tachycardia     Family History: Family History  Problem Relation Age of Onset  . Hypertension Mother   . Cancer Father        prostate  . Hypertension Father   . Kidney disease Neg Hx   . Kidney cancer Neg Hx   . Bladder Cancer Neg Hx     Social History:  reports that he has never smoked. He has never used smokeless tobacco. He reports that he does not drink alcohol or use drugs.  ROS: UROLOGY Frequent Urination?: No Hard to postpone urination?: No Burning/pain with urination?: No Get up at night to urinate?: Yes Leakage of urine?: No Urine stream starts and stops?: No Trouble starting stream?: No Do you have to strain to urinate?: No Blood in urine?: No Urinary tract infection?: Yes Sexually transmitted disease?: No Injury to kidneys or bladder?: No Painful intercourse?: No Weak stream?: No Erection problems?: No Penile pain?: No  Gastrointestinal Nausea?: No Vomiting?: No Indigestion/heartburn?: No Diarrhea?: No Constipation?: No  Constitutional Fever: No Night sweats?: No Weight loss?: No Fatigue?: No  Skin Skin rash/lesions?: No Itching?: No  Eyes Blurred vision?: No Double vision?: No  Ears/Nose/Throat Sore throat?: No Sinus problems?: No  Hematologic/Lymphatic Swollen glands?: No Easy bruising?: No  Cardiovascular Leg swelling?: No Chest pain?: No  Respiratory Cough?: No Shortness of breath?: No  Endocrine Excessive thirst?: No  Musculoskeletal Back pain?: No Joint pain?: No  Neurological Headaches?: No Dizziness?: No  Psychologic Depression?: No Anxiety?: No  Physical Exam: BP (!) 151/80   Pulse 80   Ht 5\' 7"  (1.702 m)   Wt 183 lb 9.6 oz (83.3 kg)   BMI 28.76 kg/m   GU: Patient with circumcised phallus.  Urethral meatus is patent.  No penile discharge. No penile lesions or rashes. Scrotum without lesions, cysts, rashes and/or  edema.  Testicles are located scrotally bilaterally. Small bilateral hydroceles.  No masses are appreciated in the testicles. Left and right epididymis are normal. Rectal: Patient with  normal sphincter tone. Perineum without scarring or rashes. No rectal masses are appreciated. Prostate is approximately 45 grams, no nodules are appreciated. Seminal vesicles are normal.   Laboratory Data: PSA History:  6.8 ng/mL on 03/02/2013-patient started on dutasteride  3.5 ng/mL on 07/20/2013  4.3 ng/mL on  06/07/2014    4.4 ng/mL on 12/27/2014    4.1 ng/mL on 06/30/2015    6.0 ng/mL on 03/13/2017        Assessment & Plan:    1. History of elevated PSA  - Patient has a history of elevated PSA's who has had two negative biopsies in the past  - He was started on dutasteride in April 2014  - His most recent PSA was 6.0 ng/mL on 03/13/2017 - he states he gets a lot of "UTI's" and feel that he had one when his PSA was taken in May - he would like to take antibiotics prior to his next PSA draw - I explained to him that that is no longer indicated, but he became distressed about not taking an antibiotic prior to PSA draw as he's had several biopsies in the past and is very anxious about undergoing another biopsy at this time - I did explain to him that a biopsy may not be necessary if the PSA continues to rise that we could maybe pursue an MRI if it was not cost prohibitive for the patient - he would still like to take the antibiotic and then have the PSA repeated  - He will RTC in 11/2017 for PSA only  2. BPH with LUTS  - IPSS score is 6/2, it is improved  - Continue conservative management, avoiding bladder irritants and timed voiding's  - Continue dutasteride 0.5 mg daily; refill given  - RTC in 12 months for IPSS and exam   - BLADDER SCAN AMB NON-IMAGING  3. Bilateral hydroceles  - Patient has bilateral hydroceles as confirmed by scrotal ultrasound performed in March 2016  - They are not  bothersome to him at this time  - We will continue conservative management  - We will readdress when he returns in 6 months  4. Prostatitis  - Patient has symptoms of prostatitis for which he takes Cipro  - Patient and I had the discussion concerning antibiotic stewardship and it is not recommended to take antibiotics without documented infections  - Patient voiced his understanding of my concerns and is understanding of the risks, but he persisted on having the Cipro on hand and became quiet anxious when he considered not having the prescription - I was able to have him take another antibiotic that does not have the the significant number of Black Box warnings  - Script given for Augementin   Return for January PSA only.  Zara Council, [facility] [address], [place], [place] [address] [phone]

## 2017-08-14 ENCOUNTER — Ambulatory Visit (INDEPENDENT_AMBULATORY_CARE_PROVIDER_SITE_OTHER): Payer: Medicare Other | Admitting: Urology

## 2017-08-14 ENCOUNTER — Other Ambulatory Visit: Payer: Self-pay

## 2017-08-14 ENCOUNTER — Encounter: Payer: Self-pay | Admitting: Urology

## 2017-08-14 VITALS — BP 151/80 | HR 80 | Ht 67.0 in | Wt 183.6 lb

## 2017-08-14 DIAGNOSIS — N433 Hydrocele, unspecified: Secondary | ICD-10-CM

## 2017-08-14 DIAGNOSIS — N411 Chronic prostatitis: Secondary | ICD-10-CM

## 2017-08-14 DIAGNOSIS — N138 Other obstructive and reflux uropathy: Secondary | ICD-10-CM

## 2017-08-14 DIAGNOSIS — Z87898 Personal history of other specified conditions: Secondary | ICD-10-CM

## 2017-08-14 DIAGNOSIS — N401 Enlarged prostate with lower urinary tract symptoms: Secondary | ICD-10-CM

## 2017-08-14 MED ORDER — AMOXICILLIN-POT CLAVULANATE 875-125 MG PO TABS: 1.0000 | ORAL_TABLET | Freq: Two times a day (BID) | ORAL | 0 refills | Status: DC

## 2017-08-14 MED ORDER — DUTASTERIDE 0.5 MG PO CAPS
0.5000 mg | ORAL_CAPSULE | Freq: Every day | ORAL | 3 refills | Status: DC
Start: 2017-08-14 — End: 2018-04-21

## 2017-11-26 ENCOUNTER — Other Ambulatory Visit: Payer: Self-pay

## 2017-11-26 DIAGNOSIS — N401 Enlarged prostate with lower urinary tract symptoms: Secondary | ICD-10-CM

## 2017-11-27 ENCOUNTER — Other Ambulatory Visit: Payer: Medicare Other

## 2017-11-27 DIAGNOSIS — N401 Enlarged prostate with lower urinary tract symptoms: Secondary | ICD-10-CM

## 2017-11-28 LAB — PSA: PROSTATE SPECIFIC AG, SERUM: 7.3 ng/mL — AB (ref 0.0–4.0)

## 2017-12-16 ENCOUNTER — Telehealth: Payer: Self-pay

## 2017-12-16 DIAGNOSIS — R972 Elevated prostate specific antigen [PSA]: Secondary | ICD-10-CM

## 2017-12-16 NOTE — Telephone Encounter (Signed)
LMOM

## 2017-12-16 NOTE — Telephone Encounter (Signed)
-----   Message from Nori Riis, PA-C sent at 12/16/2017  9:14 AM EST ----- PSA is still elevated.  He may choose to have a MRI of his prostate for further evaluation.

## 2017-12-17 ENCOUNTER — Telehealth: Payer: Self-pay | Admitting: Urology

## 2017-12-17 ENCOUNTER — Other Ambulatory Visit: Payer: Self-pay | Admitting: Urology

## 2017-12-17 MED ORDER — DIAZEPAM 10 MG PO TABS: ORAL_TABLET | ORAL | 0 refills | Status: DC

## 2017-12-17 NOTE — Progress Notes (Signed)
Valium script given.

## 2017-12-17 NOTE — Telephone Encounter (Signed)
Spoke with pt in reference to PSA and MRI. Pt elected to proceed with MRI. Orders placed.

## 2017-12-17 NOTE — Telephone Encounter (Signed)
Lm for pt to cb to go over his instructions for his MRI   [name]

## 2018-01-07 ENCOUNTER — Telehealth: Payer: Self-pay | Admitting: Urology

## 2018-01-07 NOTE — Telephone Encounter (Signed)
He did not want to schedule at this time, he said he would call me back.  [name]

## 2018-01-07 NOTE — Telephone Encounter (Signed)
-----   Message from Nori Riis, PA-C sent at 01/07/2018  7:39 AM EST ----- It doesn't look like Mr. Dambrosia's MRI has been scheduled.  Would you look into this?

## 2018-02-12 ENCOUNTER — Telehealth: Payer: Self-pay | Admitting: Urology

## 2018-02-12 NOTE — Telephone Encounter (Signed)
-----   Message from Nori Riis, PA-C sent at 02/02/2018  8:32 PM EDT ----- Please reach out to Mr. Schnebly to see if he is ready to schedule his MRI at this time.

## 2018-02-12 NOTE — Telephone Encounter (Signed)
Called patient again and he said he was waiting until he got his physical and then he would make another appointment to see [place] and then decide.   [name]

## 2018-04-06 ENCOUNTER — Telehealth: Payer: Self-pay | Admitting: Urology

## 2018-04-06 NOTE — Telephone Encounter (Signed)
[name] is still in need of a MRI of the prostate.  He stated in April he would call back after he had his yearly physical.  Is he ready to schedule his MRI at this time?

## 2018-04-07 NOTE — Telephone Encounter (Signed)
Patient states his physical is scheduled for tomorrow, he will call when he gets his results.

## 2018-04-14 LAB — PSA: PSA: 8.4

## 2018-04-20 NOTE — Progress Notes (Signed)
04/21/2018 2:03 PM   [name] August 25, 1941 [id number]  Referring provider: Marguerita Merles, [place], [place] [address]  No chief complaint on file.   HPI: Patient is a 77 year old African American male with an elevated PSA, hydroceles and BPH with LUTS who presents today for follow up.    History of elevated PSA Patient has a history of elevated PSA's who has had two negative biopsies in the past.  He was started on dutasteride in April 2014 when his PSA was returned 6.8 ng/mL. His most recent PSA was 7.3 ng/mL on 11/27/2017.  He still continues to take the dutasteride. It was recommended that he undergo an MRI at his last visit in February.  He has not had the MRI as of this visit.    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   He has not noticed any change in size of the scrotum or is experiencing any discomfort.  BPH WITH LUTS His IPSS score today is 4, which is mild lower urinary tract symptomatology.   He is mostly satisfied with his quality life due to his urinary symptoms.   His previous IPSS score was 6/2.  His PVR previous was 0 mL.  He has been experiencing nocturia x 2.    He had these symptoms for many years.  His symptoms have been controlled on dutasteride.  He denies any dysuria, hematuria or suprapubic pain.    He has had two negative biopsies.  He also denies any recent fevers, chills, nausea or vomiting.  He has a family history of PCa, with his father who was diagnosed with prostate cancer.    IPSS    Row Name 04/21/18 1300         International Prostate Symptom Score   How often have you had the sensation of not emptying your bladder?  Less than 1 in 5     How often have you had to urinate less than every two hours?  Less than 1 in 5 times     How often have you found you stopped and started again several times when you urinated?  Not at All     How often have you found  it difficult to postpone urination?  Not at All     How often have you had a weak urinary stream?  Not at All     How often have you had to strain to start urination?  Not at All     How many times did you typically get up at night to urinate?  2 Times     Total IPSS Score  4       Quality of Life due to urinary symptoms   If you were to spend the rest of your life with your urinary condition just the way it is now how would you feel about that?  Mostly Satisfied        Score:  1-7 Mild 8-19 Moderate 20-35 Severe  PMH: Past Medical History:  Diagnosis Date  . Acute appendicitis    perforated  . Anal fistula   . Chronic kidney disease   . Diverticulitis   . Hemorrhoids, internal   . Inguinal hernia     Surgical History: Past Surgical History:  Procedure Laterality Date  . colonoscopy 2010    . HERNIA REPAIR  02/04/2012   Dr. Burt Knack  . INGUINAL HERNIA REPAIR  2013  Dr. Burt Knack  . LAPAROSCOPIC APPENDECTOMY  07/03/2011   Dr Burt Knack  . RECTAL SURGERY  08/08/2009   Dr. Pat Patrick    Home Medications:  Allergies as of 04/21/2018      Reactions   Clindamycin/lincomycin Rash   Tachycardia      Medication List        Accurate as of 04/21/18  2:03 PM. Always use your most recent med list.          amoxicillin-clavulanate 875-125 MG tablet Commonly known as:  AUGMENTIN Take 1 tablet by mouth every 12 (twelve) hours.   ciprofloxacin 500 MG tablet Commonly known as:  CIPRO Take 1 tablet (500 mg total) by mouth every 12 (twelve) hours.   diazepam 10 MG tablet Commonly known as:  VALIUM Take one tablet 30 minutes prior to the MRI.  Must have a driver.   dutasteride 0.5 MG capsule Commonly known as:  AVODART Take 1 capsule (0.5 mg total) by mouth at bedtime.   hydrochlorothiazide 25 MG tablet Commonly known as:  HYDRODIURIL Take 1 tablet by mouth at bedtime.   lovastatin 40 MG tablet Commonly known as:  MEVACOR Take 80 mg by mouth at bedtime.   polyethylene glycol  powder powder Commonly known as:  GLYCOLAX/MIRALAX As directed for colonic prep.       Allergies:  Allergies  Allergen Reactions  . Clindamycin/Lincomycin Rash    Tachycardia     Family History: Family History  Problem Relation Age of Onset  . Hypertension Mother   . Cancer Father        prostate  . Hypertension Father   . Kidney disease Neg Hx   . Kidney cancer Neg Hx   . Bladder Cancer Neg Hx     Social History:  reports that he has never smoked. He has never used smokeless tobacco. He reports that he does not drink alcohol or use drugs.  ROS: UROLOGY Frequent Urination?: No Hard to postpone urination?: No Burning/pain with urination?: No Get up at night to urinate?: No Leakage of urine?: No Urine stream starts and stops?: No Trouble starting stream?: No Do you have to strain to urinate?: No Blood in urine?: No Urinary tract infection?: No Sexually transmitted disease?: No Injury to kidneys or bladder?: No Painful intercourse?: No Weak stream?: No Erection problems?: No Penile pain?: No  Gastrointestinal Nausea?: No Vomiting?: No Indigestion/heartburn?: No Diarrhea?: No Constipation?: No  Constitutional Fever: No Night sweats?: No Weight loss?: No Fatigue?: No  Skin Skin rash/lesions?: No Itching?: No  Eyes Blurred vision?: No Double vision?: No  Ears/Nose/Throat Sore throat?: No Sinus problems?: No  Hematologic/Lymphatic Swollen glands?: No Easy bruising?: No  Cardiovascular Leg swelling?: No Chest pain?: No  Respiratory Cough?: No Shortness of breath?: No  Endocrine Excessive thirst?: No  Musculoskeletal Back pain?: No Joint pain?: No  Neurological Headaches?: No Dizziness?: No  Psychologic Depression?: No Anxiety?: No  Physical Exam: BP (!) 170/93   Pulse 78   Ht 5\' 7"  (1.702 m)   Wt 183 lb 14.4 oz (83.4 kg)   BMI 28.80 kg/m   Constitutional: Well nourished. Alert and oriented, No acute distress. HEENT:  [place] AT, moist mucus membranes. Trachea midline, no masses. Cardiovascular: No clubbing, cyanosis, or edema. Respiratory: Normal respiratory effort, no increased work of breathing. GI: Abdomen is soft, non tender, non distended, no abdominal masses. Liver and spleen not palpable.  No hernias appreciated.  Stool sample for occult testing is not indicated.   GU: No CVA  tenderness.  No bladder fullness or masses.  Patient with circumcised phallus.  Urethral meatus is patent.  No penile discharge. No penile lesions or rashes. Scrotum without lesions, cysts, rashes and/or edema.  Testicles are located scrotally bilaterally. No masses are appreciated in the testicles. Left and right epididymis are normal. Rectal: Patient with  normal sphincter tone. Anus and perineum without scarring or rashes. No rectal masses are appreciated. Prostate is approximately 45 grams, no nodules are appreciated. Seminal vesicles are normal. Skin: No rashes, bruises or suspicious lesions. Lymph: No cervical or inguinal adenopathy. Neurologic: Grossly intact, no focal deficits, moving all 4 extremities. Psychiatric: Normal mood and affect.  Laboratory Data: PSA History:  6.8 ng/mL on 03/02/2013-patient started on dutasteride  3.5 ng/mL on 07/20/2013 (7.0)  4.3 ng/mL on 06/07/2014 (8.6)    4.4 ng/mL on 12/27/2014 (8.8)    4.1 ng/mL on 06/30/2015 (8.2)    6.0 ng/mL on 03/13/2017 (12.0)    7.3 ng/mL on 11/27/2017 (14.6)    8.4 ng/mL on 04/14/2018 (16.8)  I have reviewed the labs.        Assessment & Plan:    1. Elevated PSA Patient has a history of elevated PSA's who has had two negative biopsies in the past He was started on dutasteride in April 2014 His most recent PSA was 8.4 ng/mL (16.8) ng/mL on 04/14/2018 - explained to the patient that this is concerning for prostate cancer - he would like to be schedule for a biopsy at this time  Patient will be schedule for a TRUSPBx of prostate.  The procedure is  explained and the risks involved, such as blood in urine, blood in stool, blood in semen, infection, urinary retention, and on rare occasions sepsis and death.  Patient understands the risks as explained to him and he wishes to proceed.    2. BPH with LUTS  - IPSS score is 6/2, it is improved  - Continue conservative management, avoiding bladder irritants and timed voiding's  - Continue dutasteride 0.5 mg daily; refill given  - RTC pending biopsy results   3. Bilateral hydroceles  - Patient has bilateral hydroceles as confirmed by scrotal ultrasound performed in March 2016  - They are not bothersome to him at this time  - We will continue conservative management  - We will readdress when he returns in 6 months   Return for TRUSPBx of prostate .  Zara Council, PA-C  [facility] [address] [place], [place] [address] [phone]

## 2018-04-21 ENCOUNTER — Encounter: Payer: Self-pay | Admitting: Urology

## 2018-04-21 ENCOUNTER — Ambulatory Visit (INDEPENDENT_AMBULATORY_CARE_PROVIDER_SITE_OTHER): Payer: Medicare Other | Admitting: Urology

## 2018-04-21 ENCOUNTER — Telehealth: Payer: Self-pay | Admitting: Urology

## 2018-04-21 ENCOUNTER — Other Ambulatory Visit: Payer: Self-pay

## 2018-04-21 VITALS — BP 170/93 | HR 78 | Ht 67.0 in | Wt 183.9 lb

## 2018-04-21 DIAGNOSIS — R972 Elevated prostate specific antigen [PSA]: Secondary | ICD-10-CM | POA: Diagnosis not present

## 2018-04-21 DIAGNOSIS — N401 Enlarged prostate with lower urinary tract symptoms: Secondary | ICD-10-CM | POA: Diagnosis not present

## 2018-04-21 DIAGNOSIS — N433 Hydrocele, unspecified: Secondary | ICD-10-CM | POA: Diagnosis not present

## 2018-04-21 LAB — BLADDER SCAN AMB NON-IMAGING: Scan Result: 12

## 2018-04-21 MED ORDER — DUTASTERIDE 0.5 MG PO CAPS: 0.5000 mg | ORAL_CAPSULE | Freq: Every day | ORAL | 3 refills | Status: DC

## 2018-04-21 NOTE — Patient Instructions (Signed)
Transrectal Ultrasound-Guided Biopsy °A transrectal ultrasound-guided biopsy is a procedure to remove samples of tissue from your prostate using ultrasound images to guide the procedure. The procedure is usually done to evaluate the prostate gland of men who have an elevated prostate-specific antigen (PSA). PSA is a blood test to screen for prostate cancer. The biopsy samples are taken to check for prostate cancer. °Tell a health care provider about: °· Any allergies you have. °· All medicines you are taking, including vitamins, herbs, eye drops, creams, and over-the-counter medicines. °· Any problems you or family members have had with anesthetic medicines. °· Any blood disorders you have. °· Any surgeries you have had. °· Any medical conditions you have. °What are the risks? °Generally, this is a safe procedure. However, as with any procedure, problems can occur. Possible problems include: °· Infection of your prostate. °· Bleeding from your rectum or blood in your urine. °· Difficulty urinating. °· Nerve damage (this is usually temporary). °· Damage to surrounding structures such as blood vessels, organs, and muscles, which would require other procedures. ° °What happens before the procedure? °· Do not eat or drink anything after midnight on the night before the procedure or as directed by your health care provider. °· Take medicines only as directed by your health care provider. °· Your health care provider may have you stop taking certain medicines 5-7 days before the procedure. °· You will be given an enema before the procedure. During an enema, a liquid is injected into your rectum to clear out waste. °· You may have lab tests the day of your procedure. °· Plan to have someone take you home after the procedure. °What happens during the procedure? °· You will be given medicine to help you relax (sedative) before the procedure. An IV tube will be inserted into one of your veins and used to give fluids and  medicine. °· You will be given antibiotic medicine to reduce the risk of an infection. °· You will be placed on your side for the procedure. °· A probe with lubricated gel will be placed into your rectum, and images will be taken of your prostate and surrounding structures. °· Numbing medicine will be injected into the prostate before the biopsy samples are taken. °· A biopsy needle will then be inserted and guided to your prostate with the use of the ultrasound images. °· Samples of prostate tissue will be taken, and the needle will then be removed. °· The biopsy samples will be sent to a lab to be analyzed. Results are usually back in 2-3 days. °What happens after the procedure? °· You will be taken to a recovery area where you will be monitored. °· You may have some discomfort in the rectal area. You will be given pain medicines to control this. °· You may be allowed to go home the same day, or you may need to stay in the hospital overnight. °This information is not intended to replace advice given to you by your health care provider. Make sure you discuss any questions you have with your health care provider. °Document Released: 03/08/2014 Document Revised: 03/29/2016 Document Reviewed: 06/10/2013 °Elsevier Interactive Patient Education © 2018 [employer] ° °

## 2018-04-21 NOTE — Telephone Encounter (Signed)
Pt did not want to schedule Biopsy at check out, stated with Larene Beach at check out that he would call back to schedule biopsy. FYI

## 2018-06-24 ENCOUNTER — Other Ambulatory Visit: Payer: Self-pay | Admitting: Urology

## 2018-06-24 ENCOUNTER — Ambulatory Visit (INDEPENDENT_AMBULATORY_CARE_PROVIDER_SITE_OTHER): Payer: Medicare Other | Admitting: Urology

## 2018-06-24 DIAGNOSIS — R972 Elevated prostate specific antigen [PSA]: Secondary | ICD-10-CM

## 2018-06-24 MED ORDER — DUTASTERIDE 0.5 MG PO CAPS: 0.5000 mg | ORAL_CAPSULE | Freq: Every day | ORAL | 3 refills | Status: DC

## 2018-06-24 NOTE — Progress Notes (Signed)
06/24/2018 11:17 AM   [name] [birth year] [id number]  Referring provider: Marguerita Merles, [place], [place] [address]  Chief Complaint  Patient presents with  . Prostate Biopsy    HPI: 77 year old male initially scheduled for prostate biopsy today who would like to discuss his options.  He was initially scheduled for a prostate MRI, but then never heard from the radiology department and did not really know how to go about it.  He returned back to our office and this was rediscussed.  He ultimately chose to have a prostate biopsy for which he was scheduled today.  He has a personal history of elevated PSAs.  He has been on dutasteride since April 2014.  He had 2- prostate biopsies in the past, unknown dates.  He has a family history of PCa, with his father who was diagnosed with prostate cancer.     He has minimal urinary symptoms.  No weight loss or bone pain.  PSA History:  6.8 ng/mL on 03/02/2013-patient started on dutasteride  3.5 ng/mL on 07/20/2013 (7.0)  4.3 ng/mL on 06/07/2014 (8.6)    4.4 ng/mL on 12/27/2014 (8.8)    4.1 ng/mL on 06/30/2015 (8.2)    6.0 ng/mL on 03/13/2017 (12.0)    7.3 ng/mL on 11/27/2017 (14.6)    8.4 ng/mL on 04/14/2018 (16.8)     PMH: Past Medical History:  Diagnosis Date  . Acute appendicitis    perforated  . Anal fistula   . Chronic kidney disease   . Diverticulitis   . Hemorrhoids, internal   . Inguinal hernia     Surgical History: Past Surgical History:  Procedure Laterality Date  . colonoscopy 2010    . HERNIA REPAIR  02/04/2012   Dr. Burt Knack  . INGUINAL HERNIA REPAIR  2013   Dr. Burt Knack  . LAPAROSCOPIC APPENDECTOMY  07/03/2011   Dr Burt Knack  . RECTAL SURGERY  08/08/2009   Dr. Pat Patrick    Home Medications:  Allergies as of 06/24/2018      Reactions   Clindamycin/lincomycin Rash   Tachycardia      Medication List        Accurate as of 06/24/18 11:17 AM. Always use your most recent med list.            amoxicillin-clavulanate 875-125 MG tablet Commonly known as:  AUGMENTIN Take 1 tablet by mouth every 12 (twelve) hours.   diazepam 10 MG tablet Commonly known as:  VALIUM Take one tablet 30 minutes prior to the MRI.  Must have a driver.   dutasteride 0.5 MG capsule Commonly known as:  AVODART Take 1 capsule (0.5 mg total) by mouth at bedtime.   hydrochlorothiazide 25 MG tablet Commonly known as:  HYDRODIURIL Take 1 tablet by mouth at bedtime.   lovastatin 40 MG tablet Commonly known as:  MEVACOR Take 80 mg by mouth at bedtime.   polyethylene glycol powder powder Commonly known as:  GLYCOLAX/MIRALAX As directed for colonic prep.       Allergies:  Allergies  Allergen Reactions  . Clindamycin/Lincomycin Rash    Tachycardia     Family History: Family History  Problem Relation Age of Onset  . Hypertension Mother   . Cancer Father        prostate  . Hypertension Father   . Kidney disease Neg Hx   . Kidney cancer Neg Hx   . Bladder Cancer Neg Hx     Social History:  reports that he has never  smoked. He has never used smokeless tobacco. He reports that he does not drink alcohol or use drugs.   Physical Exam: Constitutional:  Alert and oriented, No acute distress. HEENT: [place] AT, moist mucus membranes.  Trachea midline, no masses. Cardiovascular: No clubbing, cyanosis, or edema. Respiratory: Normal respiratory effort, no increased work of breathing. Skin: No rashes, bruises or suspicious lesions. Neurologic: Grossly intact, no focal deficits, moving all 4 extremities. Psychiatric: Normal mood and affect.  Laboratory Data: Lab Results  Component Value Date   WBC 7.4 01/28/2012   HGB 15.7 01/28/2012   HCT 46.6 01/28/2012   MCV 88 01/28/2012   PLT 236 01/28/2012    Lab Results  Component Value Date   CREATININE 1.11 01/28/2012    Lab Results  Component Value Date   PSA 8.4 04/14/2018     Urinalysis N/a  Pertinent  Imaging: N/a  Assessment & Plan:    1. Rising PSA level 77 year old male with steadily rising PSA despite 2- previous biopsies of unknown time and dates  He question whether or not a prostate biopsy would be beneficial today.  He was previously scheduled for an MR but ultimately did not follow through with that due to concerns about scheduling amongst others.  He will also want to talk to his primary care physician first.  We had a good conversation today about the role of MR in this clinical situation.  It would be helpful to know if there is any lesions that can be targeted using MRI fusion biopsy in the setting of continuously rising PSA despite previous negative biopsies.  Alternatively he was offered to pursue biopsy today as scheduled.  At this point time, he would indeed like to undergo prostate MR.  I will call with these results.  If the lesion is identified, we will likely refer him to [facility] urology in [facility] for prostate fusion biopsy.  He is agreeable this plan.  - MR PROSTATE W WO CONTRAST; Future   Return for will call with MRI result (consider fusion biopsy if lesion identified).  Hollice Espy, MD  [facility] [address] [place], [place] [address] [phone]  I spent 15 min with this patient of which greater than 50% was spent in counseling and coordination of care with the patient.

## 2018-06-24 NOTE — Progress Notes (Signed)
Refill  For dutasteride sent in.

## 2018-07-08 ENCOUNTER — Ambulatory Visit: Payer: Medicare Other | Admitting: Urology

## 2018-07-18 ENCOUNTER — Ambulatory Visit
Admission: RE | Admit: 2018-07-18 | Discharge: 2018-07-18 | Disposition: A | Discharge status: Home / Self Care | Payer: Medicare Other | Source: Ambulatory Visit | Attending: Urology | Admitting: Urology

## 2018-07-18 DIAGNOSIS — R972 Elevated prostate specific antigen [PSA]: Secondary | ICD-10-CM

## 2018-07-18 MED ORDER — GADOBENATE DIMEGLUMINE 529 MG/ML IV SOLN
16.0000 mL | Freq: Once | INTRAVENOUS | Status: AC | PRN
  Administered 2018-07-18: 16 mL via INTRAVENOUS

## 2018-08-01 ENCOUNTER — Ambulatory Visit: Payer: Medicare Other | Admitting: Urology

## 2018-08-05 ENCOUNTER — Ambulatory Visit (INDEPENDENT_AMBULATORY_CARE_PROVIDER_SITE_OTHER): Payer: Medicare Other | Admitting: Urology

## 2018-08-05 ENCOUNTER — Encounter: Payer: Self-pay | Admitting: Urology

## 2018-08-05 VITALS — BP 178/79 | HR 90 | Resp 16 | Ht 67.0 in | Wt 179.4 lb

## 2018-08-05 DIAGNOSIS — N200 Calculus of kidney: Secondary | ICD-10-CM

## 2018-08-05 DIAGNOSIS — N138 Other obstructive and reflux uropathy: Secondary | ICD-10-CM

## 2018-08-05 DIAGNOSIS — R972 Elevated prostate specific antigen [PSA]: Secondary | ICD-10-CM | POA: Diagnosis not present

## 2018-08-05 DIAGNOSIS — N401 Enlarged prostate with lower urinary tract symptoms: Secondary | ICD-10-CM | POA: Diagnosis not present

## 2018-08-05 NOTE — Progress Notes (Signed)
08/05/2018 4:45 PM   [name] 1941-10-14 [id number]  Referring provider: Marguerita Merles, [address] [place], [place] [address]  Chief Complaint  Patient presents with  . Results    HPI: 77 year old male with a significant history of elevated/ rising PSA who returns today following prostate MRI to discuss his results.  He has a personal history of elevated PSAs.  He has been on dutasteride since April 2014.  He had 2- prostate biopsies in the past, unknown dates.  PSA trend as below.    Prostate MRI on 07/18/2018 showed a prostate volume of 50 cc, multiple small circumscribed BPH nodules without evidence of any high-grade lesions, PI-RADS 1.  There is no evidence of lymphadenopathy or any other concerning findings.  He has a family history of PCa, with his father who was diagnosed with prostate cancer.   He has minimal urinary symptoms.  No weight loss or bone pain.  He does have evidence of chronic outlet obstruction with a thickened bladder wall diffusely incidentally on MRI.  He continues on dutasteride.  He also reports today that he has been having issues with kidney stones.  He passes approximately 1 kidney stone per year.  He sees them in the toilet.  He is never had to have surgery for kidney stones.  He had a recent episode of flank pain a few months ago and subsequently saw the stone.  His flank pain resolved immediately thereafter he has not had a recurrence.  He is interested to know how to prevent kidney stones and   PMH: Past Medical History:  Diagnosis Date  . Acute appendicitis    perforated  . Anal fistula   . Chronic kidney disease   . Diverticulitis   . Hemorrhoids, internal   . Inguinal hernia     Surgical History: Past Surgical History:  Procedure Laterality Date  . colonoscopy 2010    . HERNIA REPAIR  02/04/2012   Dr. Burt Knack  . INGUINAL HERNIA REPAIR  2013   Dr. Burt Knack  . LAPAROSCOPIC APPENDECTOMY  07/03/2011   Dr Burt Knack  .  RECTAL SURGERY  08/08/2009   Dr. Pat Patrick    Home Medications:  Allergies as of 08/05/2018      Reactions   Clindamycin/lincomycin Rash   Tachycardia      Medication List        Accurate as of 08/05/18  4:45 PM. Always use your most recent med list.          amoxicillin-clavulanate 875-125 MG tablet Commonly known as:  AUGMENTIN Take 1 tablet by mouth every 12 (twelve) hours.   diazepam 10 MG tablet Commonly known as:  VALIUM Take one tablet 30 minutes prior to the MRI.  Must have a driver.   dutasteride 0.5 MG capsule Commonly known as:  AVODART Take 1 capsule (0.5 mg total) by mouth at bedtime.   hydrochlorothiazide 25 MG tablet Commonly known as:  HYDRODIURIL Take 1 tablet by mouth at bedtime.   lovastatin 40 MG tablet Commonly known as:  MEVACOR Take 80 mg by mouth at bedtime.   polyethylene glycol powder powder Commonly known as:  GLYCOLAX/MIRALAX As directed for colonic prep.       Allergies:  Allergies  Allergen Reactions  . Clindamycin/Lincomycin Rash    Tachycardia     Family History: Family History  Problem Relation Age of Onset  . Hypertension Mother   . Cancer Father        prostate  .  Hypertension Father   . Kidney disease Neg Hx   . Kidney cancer Neg Hx   . Bladder Cancer Neg Hx     Social History:  reports that he has never smoked. He has never used smokeless tobacco. He reports that he does not drink alcohol or use drugs.  ROS: 12 point review of systems was performed is negative other than as per HPI.  Physical Exam: BP (!) 178/79   Pulse 90   Resp 16   Ht 5\' 7"  (1.702 m)   Wt 179 lb 6.4 oz (81.4 kg)   BMI 28.10 kg/m   Constitutional:  Alert and oriented, No acute distress. HEENT: [place] AT, moist mucus membranes.  Trachea midline, no masses. Cardiovascular: No clubbing, cyanosis, or edema. Respiratory: Normal respiratory effort, no increased work of breathing. Skin: No rashes, bruises or suspicious lesions. Neurologic: Grossly  intact, no focal deficits, moving all 4 extremities. Psychiatric: Normal mood and affect.  Laboratory Data: Lab Results  Component Value Date   WBC 7.4 01/28/2012   HGB 15.7 01/28/2012   HCT 46.6 01/28/2012   MCV 88 01/28/2012   PLT 236 01/28/2012    Lab Results  Component Value Date   CREATININE 1.11 01/28/2012   PSA History:  6.8 ng/mL on 03/02/2013-patient started on dutasteride  3.5 ng/mL on 07/20/2013 (7.0)  4.3 ng/mL on 06/07/2014 (8.6) 4.4 ng/mL on 12/27/2014 (8.8) 4.1 ng/mL on 06/30/2015 (8.2) 6.0 ng/mL on 03/13/2017(12.0) 7.3 ng/mL on 11/27/2017 (14.6) 8.4 ng/mL on 04/14/2018 (16.8)   Urinalysis N/a  Pertinent Imaging: CLINICAL DATA:  Rising PSA level. Previous negative prostate biopsy.  Creatinine was obtained on site at [facility] at [address]  Results: Creatinine 1.4 mg/dL.  EXAM: MR PROSTATE WITHOUT AND WITH CONTRAST  TECHNIQUE: Multiplanar multisequence MRI images were obtained of the pelvis centered about the prostate. Pre and post contrast images were obtained.  CONTRAST:  41mL MULTIHANCE GADOBENATE DIMEGLUMINE 529 MG/ML IV SOLN  COMPARISON:  None.  FINDINGS: Prostate:  -- Peripheral Zone: Diffuse thinning noted. No abnormality seen on ADC and high b-value DWI sequences.  -- Transition/Central Zone: Multiple small circumscribed BPH nodules noted, but no suspicious nodules with obscured or non-circumscribed margins seen.  -- Volume:  4.8 x 3.8 x 5.2 cm (volume = 50 cm^3)  Transcapsular spread:  Absent  Seminal vesicle involvement:  Absent  Neurovascular bundle involvement:  Absent  Pelvic adenopathy: None visualized  Bone metastasis: None visualized  Other: Mild diffuse bladder wall thickening, consistent with chronic bladder outlet obstruction. A small left inguinal hernia is seen containing fat and small amount of fluid.  IMPRESSION: No radiographic evidence of  high-grade prostate carcinoma. PI-RADS 1: Very Low (clinically significant cancer is highly unlikely to be present)   Electronically Signed   By: Earle Gell M.D.   On: 07/18/2018 15:26  MRI was personally reviewed today.  Assessment & Plan:    1. Rising PSA level Personal history of elevated and rising PSA Previous prostate biopsies have been negative in the remote past Prostate MRI is extremely reassuring despite rising PSA He continues to want to defer prostate biopsy due to concern for discomfort and complications-he understands the risk of possible missed diagnoses although unlikely Given his age and comorbidities, agree with continued surveillance, will repeat PSA in 1 year and reassess  2. Kidney stones History of recurrent kidney stones Patient was offered a KUB for baseline to assess number and location of stones given lack of recent imaging We discussed  general stone prevention techniques including drinking plenty water with goal of producing 2.5 L urine daily, increased citric acid intake, avoidance of high oxalate containing foods, and decreased salt intake.  Information about dietary recommendations given today.   3. BPH with obstruction/lower urinary tract symptoms Continue dutasteride Symptoms well controlled Incidental findings of chronic outlet obstruction on MRI   Return in about 1 year (around 08/06/2019) for PSA .  Hollice Espy, MD  [facility] [address] [place], [place] [address] [phone]

## 2018-12-26 ENCOUNTER — Emergency Department: Payer: Medicare Other

## 2018-12-26 ENCOUNTER — Encounter: Payer: Self-pay | Admitting: Emergency Medicine

## 2018-12-26 ENCOUNTER — Inpatient Hospital Stay
Admission: EM | Admit: 2018-12-26 | Discharge: 2018-12-30 | DRG: 389 | Disposition: A | Discharge status: Home / Self Care | Payer: Medicare Other | Attending: General Surgery | Admitting: General Surgery

## 2018-12-26 ENCOUNTER — Other Ambulatory Visit: Payer: Self-pay

## 2018-12-26 DIAGNOSIS — N401 Enlarged prostate with lower urinary tract symptoms: Secondary | ICD-10-CM | POA: Diagnosis present

## 2018-12-26 DIAGNOSIS — I129 Hypertensive chronic kidney disease with stage 1 through stage 4 chronic kidney disease, or unspecified chronic kidney disease: Secondary | ICD-10-CM | POA: Diagnosis present

## 2018-12-26 DIAGNOSIS — Z809 Family history of malignant neoplasm, unspecified: Secondary | ICD-10-CM | POA: Diagnosis not present

## 2018-12-26 DIAGNOSIS — K4030 Unilateral inguinal hernia, with obstruction, without gangrene, not specified as recurrent: Secondary | ICD-10-CM

## 2018-12-26 DIAGNOSIS — N432 Other hydrocele: Secondary | ICD-10-CM | POA: Diagnosis present

## 2018-12-26 DIAGNOSIS — D72829 Elevated white blood cell count, unspecified: Secondary | ICD-10-CM | POA: Diagnosis present

## 2018-12-26 DIAGNOSIS — N183 Chronic kidney disease, stage 3 (moderate): Secondary | ICD-10-CM | POA: Diagnosis present

## 2018-12-26 DIAGNOSIS — K56699 Other intestinal obstruction unspecified as to partial versus complete obstruction: Principal | ICD-10-CM | POA: Diagnosis present

## 2018-12-26 DIAGNOSIS — Z79899 Other long term (current) drug therapy: Secondary | ICD-10-CM

## 2018-12-26 DIAGNOSIS — Z8249 Family history of ischemic heart disease and other diseases of the circulatory system: Secondary | ICD-10-CM | POA: Diagnosis not present

## 2018-12-26 DIAGNOSIS — N138 Other obstructive and reflux uropathy: Secondary | ICD-10-CM | POA: Diagnosis present

## 2018-12-26 DIAGNOSIS — Z881 Allergy status to other antibiotic agents status: Secondary | ICD-10-CM | POA: Diagnosis not present

## 2018-12-26 DIAGNOSIS — K56609 Unspecified intestinal obstruction, unspecified as to partial versus complete obstruction: Secondary | ICD-10-CM

## 2018-12-26 DIAGNOSIS — R972 Elevated prostate specific antigen [PSA]: Secondary | ICD-10-CM | POA: Diagnosis present

## 2018-12-26 HISTORY — DX: Essential (primary) hypertension: I10

## 2018-12-26 LAB — CBC
HCT: 42.7 % (ref 39.0–52.0)
HEMOGLOBIN: 15.3 g/dL (ref 13.0–17.0)
MCH: 28.9 pg (ref 26.0–34.0)
MCHC: 35.8 g/dL (ref 30.0–36.0)
MCV: 80.6 fL (ref 80.0–100.0)
Platelets: 311 10*3/uL (ref 150–400)
RBC: 5.3 MIL/uL (ref 4.22–5.81)
RDW: 12.7 % (ref 11.5–15.5)
WBC: 13.2 10*3/uL — AB (ref 4.0–10.5)
nRBC: 0 % (ref 0.0–0.2)

## 2018-12-26 LAB — APTT: aPTT: 39 seconds — ABNORMAL HIGH (ref 24–36)

## 2018-12-26 LAB — COMPREHENSIVE METABOLIC PANEL
ALBUMIN: 3.3 g/dL — AB (ref 3.5–5.0)
ALK PHOS: 49 U/L (ref 38–126)
ALT: 13 U/L (ref 0–44)
ANION GAP: 11 (ref 5–15)
AST: 20 U/L (ref 15–41)
BUN: 54 mg/dL — ABNORMAL HIGH (ref 8–23)
CALCIUM: 8.9 mg/dL (ref 8.9–10.3)
CHLORIDE: 101 mmol/L (ref 98–111)
CO2: 25 mmol/L (ref 22–32)
Creatinine, Ser: 1.82 mg/dL — ABNORMAL HIGH (ref 0.61–1.24)
GFR calc Af Amer: 40 mL/min — ABNORMAL LOW (ref >60)
GFR calc non Af Amer: 35 mL/min — ABNORMAL LOW (ref >60)
GLUCOSE: 143 mg/dL — AB (ref 70–99)
Potassium: 3.5 mmol/L (ref 3.5–5.1)
SODIUM: 137 mmol/L (ref 135–145)
Total Bilirubin: 1.9 mg/dL — ABNORMAL HIGH (ref 0.3–1.2)
Total Protein: 6.6 g/dL (ref 6.5–8.1)

## 2018-12-26 LAB — PROTIME-INR
INR: 1.32
PROTHROMBIN TIME: 16.2 s — AB (ref 11.4–15.2)

## 2018-12-26 MED ORDER — IOHEXOL 300 MG/ML  SOLN
75.0000 mL | Freq: Once | INTRAMUSCULAR | Status: AC | PRN
  Administered 2018-12-26: 75 mL via INTRAVENOUS
  Filled 2018-12-26: qty 75

## 2018-12-26 MED ORDER — ACETAMINOPHEN 650 MG RE SUPP: 650.0000 mg | Freq: Four times a day (QID) | RECTAL | Status: DC | PRN

## 2018-12-26 MED ORDER — ACETAMINOPHEN 325 MG PO TABS: 650.0000 mg | ORAL_TABLET | Freq: Four times a day (QID) | ORAL | Status: DC | PRN

## 2018-12-26 MED ORDER — SODIUM CHLORIDE 0.9 % IV SOLN
INTRAVENOUS | Status: DC
  Administered 2018-12-27 – 2018-12-30 (×6): via INTRAVENOUS

## 2018-12-26 MED ORDER — DUTASTERIDE 0.5 MG PO CAPS
0.5000 mg | ORAL_CAPSULE | Freq: Every day | ORAL | Status: DC
  Administered 2018-12-29: 0.5 mg via ORAL
  Filled 2018-12-26 (×5): qty 1

## 2018-12-26 MED ORDER — FAMOTIDINE IN NACL 20-0.9 MG/50ML-% IV SOLN
20.0000 mg | Freq: Two times a day (BID) | INTRAVENOUS | Status: DC
  Administered 2018-12-27 – 2018-12-29 (×6): 20 mg via INTRAVENOUS
  Filled 2018-12-26 (×6): qty 50

## 2018-12-26 MED ORDER — IOHEXOL 300 MG/ML  SOLN
30.0000 mL | Freq: Once | INTRAMUSCULAR | Status: AC | PRN
  Administered 2018-12-26: 30 mL via ORAL
  Filled 2018-12-26: qty 30

## 2018-12-26 MED ORDER — MORPHINE SULFATE (PF) 2 MG/ML IV SOLN: 2.0000 mg | INTRAVENOUS | Status: DC | PRN

## 2018-12-26 MED ORDER — ONDANSETRON 4 MG PO TBDP: 4.0000 mg | ORAL_TABLET | Freq: Four times a day (QID) | ORAL | Status: DC | PRN

## 2018-12-26 MED ORDER — HYDROCHLOROTHIAZIDE 25 MG PO TABS
25.0000 mg | ORAL_TABLET | Freq: Every day | ORAL | Status: DC
  Administered 2018-12-27 – 2018-12-30 (×3): 25 mg via ORAL
  Filled 2018-12-26 (×4): qty 1

## 2018-12-26 MED ORDER — ONDANSETRON HCL 4 MG/2ML IJ SOLN: 4.0000 mg | Freq: Four times a day (QID) | INTRAMUSCULAR | Status: DC | PRN

## 2018-12-26 MED ORDER — HYDROCODONE-ACETAMINOPHEN 5-325 MG PO TABS: 1.0000 | ORAL_TABLET | ORAL | Status: DC | PRN

## 2018-12-26 MED ORDER — ENOXAPARIN SODIUM 40 MG/0.4ML ~~LOC~~ SOLN
40.0000 mg | SUBCUTANEOUS | Status: DC
  Administered 2018-12-27 – 2018-12-29 (×4): 40 mg via SUBCUTANEOUS
  Filled 2018-12-26 (×4): qty 0.4

## 2018-12-26 NOTE — H&P (Signed)
SURGICAL HISTORY AND PHYSICAL NOTE   HISTORY OF PRESENT ILLNESS (HPI):  78 y.o. male presented to [facility] ED for evaluation of abdominal pain. Patient reports since having abdominal pain couple of days ago.  The pain has been getting worse.  He has developed nausea but no vomiting.  He refers that he feels a hernia on the left side of the abdomen.  He has been evaluated by this hernia before and also has significant hydrocele that has been followed by urology.  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  He cannot recall his last bowel movement or gas.  Pain is generalized and there is no pain radiation.  There is no alleviating or aggravating factors.  At the ED CT scan was done and was found with small bowel obstruction with transition point anterior distal ileum.  There is also a left inguinal hernia without bowel content.  There is also a hydrocele.  I personally evaluated the images.  There is mild leukocytosis and acute renal insufficiency.  Surgery is consulted by Dr. Corky Downs in this context for evaluation and management of small bowel obstruction.  PAST MEDICAL HISTORY (PMH):  Past Medical History:  Diagnosis Date  . Acute appendicitis    perforated  . Anal fistula   . Chronic kidney disease   . Diverticulitis   . Hemorrhoids, internal   . Hypertension   . Inguinal hernia      PAST SURGICAL HISTORY [facility]):  Past Surgical History:  Procedure Laterality Date  . colonoscopy 2010    . HERNIA REPAIR  02/04/2012   Dr. Burt Knack  . INGUINAL HERNIA REPAIR  2013   Dr. Burt Knack  . LAPAROSCOPIC APPENDECTOMY  07/03/2011   Dr Burt Knack  . RECTAL SURGERY  08/08/2009   Dr. Pat Patrick     MEDICATIONS:  Prior to Admission medications   Medication Sig Start Date End Date Taking? Authorizing Provider  ciprofloxacin (CIPRO) 500 MG tablet Take 500 mg by mouth 2 (two) times daily. 12/23/18  Yes [provider]  dutasteride (AVODART) 0.5 MG capsule Take 1  capsule (0.5 mg total) by mouth at bedtime. 06/24/18  Yes McGowan, Larene Beach A, PA-C  hydrochlorothiazide (HYDRODIURIL) 25 MG tablet Take 1 tablet by mouth at bedtime. 05/03/15  Yes [provider]  lovastatin (MEVACOR) 40 MG tablet Take 80 mg by mouth at bedtime.    Yes [provider]  metroNIDAZOLE (FLAGYL) 500 MG tablet Take 500 mg by mouth 3 (three) times daily. 12/23/18  Yes [provider]     ALLERGIES:  Allergies  Allergen Reactions  . Clindamycin/Lincomycin Rash    Tachycardia      SOCIAL HISTORY:  Social History   Socioeconomic History  . Marital status: Married    Spouse name: Not on file  . Number of children: Not on file  . Years of education: Not on file  . Highest education level: Not on file  Occupational History  . Not on file  Social Needs  . Financial resource strain: Not on file  . Food insecurity:    Worry: Not on file    Inability: Not on file  . Transportation needs:    Medical: Not on file    Non-medical: Not on file  Tobacco Use  . Smoking status: Never Smoker  . Smokeless tobacco: Never Used  Substance and Sexual Activity  . Alcohol use: No  . Drug use: No  . Sexual activity: Not on file  Lifestyle  .  Physical activity:    Days per week: Not on file    Minutes per session: Not on file  . Stress: Not on file  Relationships  . Social connections:    Talks on phone: Not on file    Gets together: Not on file    Attends religious service: Not on file    Active member of club or organization: Not on file    Attends meetings of clubs or organizations: Not on file    Relationship status: Not on file  . Intimate partner violence:    Fear of current or ex partner: Not on file    Emotionally abused: Not on file    Physically abused: Not on file    Forced sexual activity: Not on file  Other Topics Concern  . Not on file  Social History Narrative  . Not on file    The patient currently resides (home / rehab facility /  nursing home): Home The patient normally is (ambulatory / bedbound): Ambulatory   FAMILY HISTORY:  Family History  Problem Relation Age of Onset  . Hypertension Mother   . Cancer Father        prostate  . Hypertension Father   . Kidney disease Neg Hx   . Kidney cancer Neg Hx   . Bladder Cancer Neg Hx      REVIEW OF SYSTEMS:  Constitutional: denies weight loss, fever, chills, or sweats  Eyes: denies any other vision changes, history of eye injury  ENT: denies sore throat, hearing problems  Respiratory: denies shortness of breath, wheezing  Cardiovascular: denies chest pain, palpitations  Gastrointestinal: C. difficile abdominal pain, N/V, denies diarrhea Genitourinary: denies burning with urination or urinary frequency Musculoskeletal: denies any other joint pains or cramps  Skin: denies any other rashes or skin discolorations  Neurological: denies any other headache, dizziness, weakness  Psychiatric: denies any other depression, anxiety   All other review of systems were negative   VITAL SIGNS:  Temp:  [98.2 F (36.8 C)] 98.2 F (36.8 C) (02/21 1609) Pulse Rate:  [110] 110 (02/21 1609) Resp:  [18] 18 (02/21 1609) BP: (167)/(86) 167/86 (02/21 1609) SpO2:  [97 %] 97 % (02/21 1609) Weight:  [80.3 kg] 80.3 kg (02/21 1613)     Height: 5\' 8"  (172.7 cm) Weight: 80.3 kg BMI (Calculated): 26.92   INTAKE/OUTPUT:  This shift: No intake/output data recorded.  Last 2 shifts: @IOLAST2SHIFTS @   PHYSICAL EXAM:  Constitutional:  -- Normal body habitus  -- Awake, alert, and oriented x3  Eyes:  -- Pupils equally round and reactive to light  -- No scleral icterus  Ear, nose, and throat:  -- No jugular venous distension  Pulmonary:  -- No crackles  -- Equal breath sounds bilaterally -- Breathing non-labored at rest Cardiovascular:  -- S1, S2 present  -- No pericardial rubs Gastrointestinal:  -- Abdomen soft, mild tender, distended and tympanic, no guarding or rebound  tenderness --The small inguinal hernia on the left side is soft and mildly tender.  There is no sign of strangulation.  No sign of bowel in the hernia on the physical exam or on the CT scan. -- No abdominal masses appreciated, pulsatile or otherwise  Musculoskeletal and Integumentary:  -- Wounds or skin discoloration: None appreciated -- Extremities: B/L UE and LE FROM, hands and feet warm, no edema  Neurologic:  -- Motor function: intact and symmetric -- Sensation: intact and symmetric   Labs:  CBC Latest Ref Rng & Units 12/26/2018  01/28/2012  WBC 4.0 - 10.5 K/uL 13.2(H) 7.4  Hemoglobin 13.0 - 17.0 g/dL 15.3 15.7  Hematocrit 39.0 - 52.0 % 42.7 46.6  Platelets 150 - 400 K/uL 311 236   CMP Latest Ref Rng & Units 12/26/2018 01/28/2012  Glucose 70 - 99 mg/dL 143(H) 66  BUN 8 - 23 mg/dL 54(H) 22(H)  Creatinine 0.61 - 1.24 mg/dL 1.82(H) 1.11  Sodium 135 - 145 mmol/L 137 142  Potassium 3.5 - 5.1 mmol/L 3.5 3.7  Chloride 98 - 111 mmol/L 101 104  CO2 22 - 32 mmol/L 25 31  Calcium 8.9 - 10.3 mg/dL 8.9 9.0  Total Protein 6.5 - 8.1 g/dL 6.6 -  Total Bilirubin 0.3 - 1.2 mg/dL 1.9(H) -  Alkaline Phos 38 - 126 U/L 49 -  AST 15 - 41 U/L 20 -  ALT 0 - 44 U/L 13 -   Imaging studies:  EXAM: CT ABDOMEN AND PELVIS WITH CONTRAST  TECHNIQUE: Multidetector CT imaging of the abdomen and pelvis was performed using the standard protocol following bolus administration of intravenous contrast.  CONTRAST:  54mL OMNIPAQUE IOHEXOL 300 MG/ML  SOLN  COMPARISON:  CT abdomen pelvis dated July 02, 2011, and Mar 22, 2009.  FINDINGS: Lower chest: No acute abnormality. Unchanged right lower lobe scarring.  Hepatobiliary: Scattered subcentimeter low-density lesions in the liver remain too small to characterize but are unchanged and likely represent cysts. 2.8 x 2.2 cm hypodensity in segment 8 is stable to slightly decreased in size, consistent with benign etiology. Interval increase in size of the  now 3.0 cm lesion in the left hepatic lobe, previously characterized as a hemangioma. No new focal liver lesion. Cholelithiasis. No gallbladder wall thickening or biliary dilatation.  Pancreas: Unremarkable. No pancreatic ductal dilatation or surrounding inflammatory changes.  Spleen: Normal in size. 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 Additional similar appearing 1.3 cm lesion within the spleen has also slightly increased in size.  Adrenals/Urinary Tract: Slight interval increase in size of a right adrenal nodule, now measuring 1.6 cm, previously 1.3 cm, likely an adenoma. The left adrenal gland is unremarkable. Small bilateral renal cysts have slightly increased in size. No suspicious renal mass. No renal or ureteral calculi. No hydronephrosis. Multiple small layering calculi within the posterior bladder. No bladder wall thickening.  Stomach/Bowel: Diffusely dilated small bowel with gradual transition to nondilated distal ileum. The stomach is within normal limits. Colonic diverticulosis. Focal soft tissue density and calcification at the medial base of the cecum are unchanged and may represent the appendix stump containing appendicoliths. Patient has a reported history of appendectomy.  Vascular/Lymphatic: Aortic atherosclerosis. No enlarged abdominal or pelvic lymph nodes.  Reproductive: Mild prostatomegaly.  Other: Moderate left inguinal hernia containing fat and a loculated 4.1 x 3.8 x 5.3 cm fluid collection. Small amount of loculated fluid and fat within a small umbilical hernia. Prior right inguinal hernia repair. Trace fluid in the pelvis. No pneumoperitoneum.  Musculoskeletal: No acute or significant osseous findings.  IMPRESSION: 1. Moderate left inguinal hernia containing fat and a loculated 5.3 cm fluid collection that may represent a spermatic  cord hydrocele. No herniated bowel. 2. Diffusely dilated small bowel with gradual transition to non-dilated distal ileum, favoring severe ileus or partial obstruction. Appearance is similar to prior CT from August 2012. 3. Multiple small layering bladder calculi. 4. Cholelithiasis.   Electronically Signed   By: Titus Dubin M.D.   On:  12/26/2018 19:56  Assessment/Plan:  78 y.o. male with mall bowel obstruction, complicated by pertinent comorbidities including hypertension, BPH, previous right inguinal hernia repair, umbilical hernia. Patient with small bowel obstruction most likely due to adhesion.  Patient has previous surgical history with laparoscopic appendectomy and inguinal hernia repair.  The CT scan was concerning of left inguinal hernia and small bowel obstruction.  There is no bowel in the inguinal hernia on the physical exam or the CT scan.  I considered that these small bowel obstruction is due to adhesion and not from the inguinal hernia.  I will put an NG tube, IV hydrate the patient, bowel rest and follow with physical exam.  There is no indication for emergent inguinal hernia repair at this moment.  We will also follow with serial labs.  Patient was oriented about these recommendations and he agreed to proceed with NG tube.  Patient also oriented that if he does not improve he will need surgical management of small bowel obstruction.  Arnold Long, MD

## 2018-12-26 NOTE — ED Triage Notes (Signed)
Patient reports hernia in lower abdomen x3 weeks. States he is able to get it back in but this causes significant pain. Reports constant pain increasing over the last 3 days.

## 2018-12-26 NOTE — ED Notes (Addendum)
Surgeon at bedside with patient. 

## 2018-12-26 NOTE — ED Provider Notes (Signed)
[facility] Emergency Department Provider Note   ____________________________________________    I have reviewed the triage vital signs and the nursing notes.   HISTORY  Chief Complaint Hernia     HPI [name] is a 78 y.o. male who presents with complaints of hernia.  Patient reports he has had an intermittent hernia in the left groin for some time however over the last 3 days he has been unable to push it back in.  He reports the pain is worsening especially when he moves.  If he lies flat the pain is relieved.  Denies fevers or chills.  No nausea or vomiting.  Has a history of an appendectomy and right inguinal hernia repair  Past Medical History:  Diagnosis Date  . Acute appendicitis    perforated  . Anal fistula   . Chronic kidney disease   . Diverticulitis   . Hemorrhoids, internal   . Hypertension   . Inguinal hernia     Patient Active Problem List   Diagnosis Date Noted  . BPH with obstruction/lower urinary tract symptoms 07/19/2015  . Elevated PSA 07/19/2015  . Hydrocele, bilateral 07/19/2015  . Ventral hernia without obstruction or gangrene 05/24/2015  . Adenomatous polyp of colon 08/23/2014    Past Surgical History:  Procedure Laterality Date  . colonoscopy 2010    . HERNIA REPAIR  02/04/2012   Dr. Burt Knack  . INGUINAL HERNIA REPAIR  2013   Dr. Burt Knack  . LAPAROSCOPIC APPENDECTOMY  07/03/2011   Dr Burt Knack  . RECTAL SURGERY  08/08/2009   Dr. Pat Patrick    Prior to Admission medications   Medication Sig Start Date End Date Taking? Authorizing Provider  amoxicillin-clavulanate (AUGMENTIN) 875-125 MG tablet Take 1 tablet by mouth every 12 (twelve) hours. Patient not taking: Reported on 08/05/2018 08/14/17   Zara Council A, PA-C  diazepam (VALIUM) 10 MG tablet Take one tablet 30 minutes prior to the MRI.  Must have a driver. 12/17/17   Zara Council A, PA-C  dutasteride (AVODART) 0.5 MG capsule Take 1 capsule (0.5 mg total)  by mouth at bedtime. 06/24/18   Zara Council A, PA-C  hydrochlorothiazide (HYDRODIURIL) 25 MG tablet Take 1 tablet by mouth at bedtime. 05/03/15   [provider]  lovastatin (MEVACOR) 40 MG tablet Take 80 mg by mouth at bedtime.     [provider]  polyethylene glycol powder (GLYCOLAX/MIRALAX) powder As directed for colonic prep. 08/23/14   [provider]     Allergies Clindamycin/lincomycin  Family History  Problem Relation Age of Onset  . Hypertension Mother   . Cancer Father        prostate  . Hypertension Father   . Kidney disease Neg Hx   . Kidney cancer Neg Hx   . Bladder Cancer Neg Hx     Social History Social History   Tobacco Use  . Smoking status: Never Smoker  . Smokeless tobacco: Never Used  Substance Use Topics  . Alcohol use: No  . Drug use: No    Review of Systems  Constitutional: No fever/chills Eyes: No visual changes.  ENT: No sore throat. Cardiovascular: Denies chest pain. Respiratory: Denies shortness of breath. Gastrointestinal: Left groin pain as above Genitourinary: Negative for dysuria. Musculoskeletal: Negative for back pain. Skin: Negative for rash. Neurological: Negative for headaches   ____________________________________________   PHYSICAL EXAM:  VITAL SIGNS: ED Triage Vitals  Enc Vitals Group     BP 12/26/18 1609 (!) 167/86  Pulse Rate 12/26/18 1609 (!) 110     Resp 12/26/18 1609 18     Temp 12/26/18 1609 98.2 F (36.8 C)     Temp Source 12/26/18 1609 Oral     SpO2 12/26/18 1609 97 %     Weight 12/26/18 1613 80.3 kg (177 lb)     Height 12/26/18 1613 1.727 m (5\' 8" )     Head Circumference --      Peak Flow --      Pain Score 12/26/18 1613 0     Pain Loc --      Pain Edu? --      Excl. in [place]? --     Constitutional: Alert and oriented.  Eyes: Conjunctivae are normal.   Nose: No congestion/rhinnorhea. Mouth/Throat: Mucous membranes are moist.   Cardiovascular: Normal rate, regular  rhythm. Grossly normal heart sounds.  Good peripheral circulation. Respiratory: Normal respiratory effort.  No retractions. Lungs CTAB. Gastrointestinal: Soft and nontender. No distention.  No CVA tenderness. Genitourinary: Left groin hernia, mild tenderness and erythema, unable to reduce Musculoskeletal: Warm and well perfused Neurologic:  Normal speech and language. No gross focal neurologic deficits are appreciated.  Skin:  Skin is warm, dry and intact. No rash noted. Psychiatric: Mood and affect are normal. Speech and behavior are normal.  ____________________________________________   LABS (all labs ordered are listed, but only abnormal results are displayed)  Labs Reviewed  CBC - Abnormal; Notable for the following components:      Result Value   WBC 13.2 (*)    All other components within normal limits  COMPREHENSIVE METABOLIC PANEL - Abnormal; Notable for the following components:   Glucose, Bld 143 (*)    BUN 54 (*)    Creatinine, Ser 1.82 (*)    Albumin 3.3 (*)    Total Bilirubin 1.9 (*)    GFR calc non Af Amer 35 (*)    GFR calc Af Amer 40 (*)    All other components within normal limits  APTT - Abnormal; Notable for the following components:   aPTT 39 (*)    All other components within normal limits  PROTIME-INR - Abnormal; Notable for the following components:   Prothrombin Time 16.2 (*)    All other components within normal limits   ____________________________________________  EKG  None ____________________________________________  RADIOLOGY  CT abdomen pelvis ____________________________________________   PROCEDURES  Procedure(s) performed: No  Procedures   Critical Care performed:No ____________________________________________   INITIAL IMPRESSION / ASSESSMENT AND PLAN / ED COURSE  Pertinent labs & imaging results that were available during my care of the patient were reviewed by me and considered in my medical decision making (see chart  for details).  Presents with left inguinal hernia, I attempted reduction unsuccessfully.  Pending labs CT imaging  CT demonstrates possible small bowel obstruction, no herniated bowel noted.  Discussed with Dr. Peyton Najjar who has studied the CT scan recommends NG tube and he will admit the patient    ____________________________________________   FINAL CLINICAL IMPRESSION(S) / ED DIAGNOSES  Final diagnoses:  Small bowel obstruction ([place])  Inguinal hernia of left side with obstruction and without gangrene        Note:  This document was prepared using Dragon voice recognition software and may include unintentional dictation errors.   Lavonia Drafts, MD 12/26/18 2147

## 2018-12-26 NOTE — ED Notes (Signed)
Pt presents to ED with c/o L sided inguinal hernia. Pt states painful to push back in, increasing pain x 3 days. Pt is alert and oriented at this time, ambulatory without difficulty.

## 2018-12-27 ENCOUNTER — Inpatient Hospital Stay: Payer: Medicare Other

## 2018-12-27 LAB — COMPREHENSIVE METABOLIC PANEL
ALBUMIN: 3.1 g/dL — AB (ref 3.5–5.0)
ALT: 13 U/L (ref 0–44)
AST: 20 U/L (ref 15–41)
Alkaline Phosphatase: 45 U/L (ref 38–126)
Anion gap: 12 (ref 5–15)
BUN: 55 mg/dL — AB (ref 8–23)
CO2: 25 mmol/L (ref 22–32)
Calcium: 8.8 mg/dL — ABNORMAL LOW (ref 8.9–10.3)
Chloride: 101 mmol/L (ref 98–111)
Creatinine, Ser: 1.67 mg/dL — ABNORMAL HIGH (ref 0.61–1.24)
GFR calc Af Amer: 45 mL/min — ABNORMAL LOW (ref >60)
GFR calc non Af Amer: 39 mL/min — ABNORMAL LOW (ref >60)
GLUCOSE: 143 mg/dL — AB (ref 70–99)
Potassium: 3.4 mmol/L — ABNORMAL LOW (ref 3.5–5.1)
SODIUM: 138 mmol/L (ref 135–145)
Total Bilirubin: 1.9 mg/dL — ABNORMAL HIGH (ref 0.3–1.2)
Total Protein: 6.6 g/dL (ref 6.5–8.1)

## 2018-12-27 LAB — CBC
HCT: 43.6 % (ref 39.0–52.0)
Hemoglobin: 15.2 g/dL (ref 13.0–17.0)
MCH: 28.6 pg (ref 26.0–34.0)
MCHC: 34.9 g/dL (ref 30.0–36.0)
MCV: 82 fL (ref 80.0–100.0)
Platelets: 338 10*3/uL (ref 150–400)
RBC: 5.32 MIL/uL (ref 4.22–5.81)
RDW: 12.7 % (ref 11.5–15.5)
WBC: 11.7 10*3/uL — ABNORMAL HIGH (ref 4.0–10.5)
nRBC: 0 % (ref 0.0–0.2)

## 2018-12-27 NOTE — ED Notes (Signed)
Shannon RN, aware of bed assigned  

## 2018-12-27 NOTE — ED Notes (Signed)
ED TO INPATIENT HANDOFF REPORT  ED Nurse Name and Phone #:   [phone]  S Name/Age/Gender [name] 78 y.o. male Room/Bed: ED04A/ED04A  Code Status   Code Status: Full Code  Home/SNF/Other Home Patient oriented to: self, place, time and situation Is this baseline? Yes   Triage Complete: Triage complete  Chief Complaint hernia  Triage Note Patient reports hernia in lower abdomen x3 weeks. States he is able to get it back in but this causes significant pain. Reports constant pain increasing over the last 3 days.    Allergies Allergies  Allergen Reactions  . Clindamycin/Lincomycin Rash    Tachycardia     Level of Care/Admitting Diagnosis ED Disposition    ED Disposition Condition [facility] [facility] Area: [place] [[id number]]  Level of Care: Med-Surg [16]  Diagnosis: Small bowel obstruction [facility]) [[id number]]  Admitting Physician: Herbert Pun [[id number]]  Attending Physician: Herbert Pun [[id number]]  Estimated length of stay: past midnight tomorrow  Certification:: I certify this patient will need inpatient services for at least 2 midnights  PT Class (Do Not Modify): Inpatient [101]  PT Acc Code (Do Not Modify): Private [1]       B Medical/Surgery History Past Medical History:  Diagnosis Date  . Acute appendicitis    perforated  . Anal fistula   . Chronic kidney disease   . Diverticulitis   . Hemorrhoids, internal   . Hypertension   . Inguinal hernia    Past Surgical History:  Procedure Laterality Date  . colonoscopy 2010    . HERNIA REPAIR  02/04/2012   Dr. Burt Knack  . INGUINAL HERNIA REPAIR  2013   Dr. Burt Knack  . LAPAROSCOPIC APPENDECTOMY  07/03/2011   Dr Burt Knack  . RECTAL SURGERY  08/08/2009   Dr. Josefine Class IV Location/Drains/Wounds Patient Lines/Drains/Airways Status   Active Line/Drains/Airways    Name:   Placement date:   Placement time:   Site:   Days:   Peripheral IV 12/26/18 Right Antecubital    12/26/18    1656    Antecubital   1   NG/OG Tube Nasogastric 16 Fr. Right nare Aucultation   12/26/18    1100    Right nare   1          Intake/Output Last 24 hours  Intake/Output Summary (Last 24 hours) at 12/27/2018 0107 Last data filed at 12/27/2018 0103 Gross per 24 hour  Intake -  Output 100 ml  Net -100 ml    Labs/Imaging Results for orders placed or performed during the hospital encounter of 12/26/18 (from the past 48 hour(s))  CBC     Status: Abnormal   Collection Time: 12/26/18  4:56 PM  Result Value Ref Range   WBC 13.2 (H) 4.0 - 10.5 K/uL   RBC 5.30 4.22 - 5.81 MIL/uL   Hemoglobin 15.3 13.0 - 17.0 g/dL   HCT 42.7 39.0 - 52.0 %   MCV 80.6 80.0 - 100.0 fL   MCH 28.9 26.0 - 34.0 pg   MCHC 35.8 30.0 - 36.0 g/dL   RDW 12.7 11.5 - 15.5 %   Platelets 311 150 - 400 K/uL   nRBC 0.0 0.0 - 0.2 %    Comment: Performed at [facility], [address] [place], [place] [address]  Comprehensive metabolic panel     Status: Abnormal   Collection Time: 12/26/18  4:56 PM  Result Value Ref Range   Sodium 137 135 -  145 mmol/L   Potassium 3.5 3.5 - 5.1 mmol/L   Chloride 101 98 - 111 mmol/L   CO2 25 22 - 32 mmol/L   Glucose, Bld 143 (H) 70 - 99 mg/dL   BUN 54 (H) 8 - 23 mg/dL   Creatinine, Ser 1.82 (H) 0.61 - 1.24 mg/dL   Calcium 8.9 8.9 - 10.3 mg/dL   Total Protein 6.6 6.5 - 8.1 g/dL   Albumin 3.3 (L) 3.5 - 5.0 g/dL   AST 20 15 - 41 U/L   ALT 13 0 - 44 U/L   Alkaline Phosphatase 49 38 - 126 U/L   Total Bilirubin 1.9 (H) 0.3 - 1.2 mg/dL   GFR calc non Af Amer 35 (L) >60 mL/min   GFR calc Af Amer 40 (L) >60 mL/min   Anion gap 11 5 - 15    Comment: Performed at [facility], [address] [place], [place] [address]  APTT     Status: Abnormal   Collection Time: 12/26/18  4:56 PM  Result Value Ref Range   aPTT 39 (H) 24 - 36 seconds    Comment:        IF BASELINE aPTT IS ELEVATED, SUGGEST PATIENT RISK ASSESSMENT BE USED TO DETERMINE  APPROPRIATE ANTICOAGULANT THERAPY. Performed at [facility], [address] [place], [place] [address]   Protime-INR     Status: Abnormal   Collection Time: 12/26/18  4:56 PM  Result Value Ref Range   Prothrombin Time 16.2 (H) 11.4 - 15.2 seconds   INR 1.32     Comment: Performed at [facility], [address] [place], [place] [address]   Ct Abdomen Pelvis W Contrast  Result Date: 12/26/2018 CLINICAL DATA:  Lower abdominal hernia. EXAM: CT ABDOMEN AND PELVIS WITH CONTRAST TECHNIQUE: Multidetector CT imaging of the abdomen and pelvis was performed using the standard protocol following bolus administration of intravenous contrast. CONTRAST:  25mL OMNIPAQUE IOHEXOL 300 MG/ML  SOLN COMPARISON:  CT abdomen pelvis dated July 02, 2011, and Mar 22, 2009. FINDINGS: Lower chest: No acute abnormality. Unchanged right lower lobe scarring. Hepatobiliary: Scattered subcentimeter low-density lesions in the liver remain too small to characterize but are unchanged and likely represent cysts. 2.8 x 2.2 cm hypodensity in segment 8 is stable to slightly decreased in size, consistent with benign etiology. Interval increase in size of the now 3.0 cm lesion in the left hepatic lobe, previously characterized as a hemangioma. No new focal liver lesion. Cholelithiasis. No gallbladder wall thickening or biliary dilatation. Pancreas: Unremarkable. No pancreatic ductal dilatation or surrounding inflammatory changes. Spleen: Normal in size. 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 Additional similar appearing 1.3 cm lesion within the spleen has also slightly increased in size. Adrenals/Urinary Tract: Slight interval increase in size of a right adrenal nodule, now measuring 1.6 cm, previously 1.3 cm, likely an adenoma. The left adrenal gland is unremarkable. Small bilateral renal cysts have slightly  increased in size. No suspicious renal mass. No renal or ureteral calculi. No hydronephrosis. Multiple small layering calculi within the posterior bladder. No bladder wall thickening. Stomach/Bowel: Diffusely dilated small bowel with gradual transition to nondilated distal ileum. The stomach is within normal limits. Colonic diverticulosis. Focal soft tissue density and calcification at the medial base of the cecum are unchanged and may represent the appendix stump containing appendicoliths. Patient has a reported history of appendectomy. Vascular/Lymphatic: Aortic atherosclerosis. No enlarged  abdominal or pelvic lymph nodes. Reproductive: Mild prostatomegaly. Other: Moderate left inguinal hernia containing fat and a loculated 4.1 x 3.8 x 5.3 cm fluid collection. Small amount of loculated fluid and fat within a small umbilical hernia. Prior right inguinal hernia repair. Trace fluid in the pelvis. No pneumoperitoneum. Musculoskeletal: No acute or significant osseous findings. IMPRESSION: 1. Moderate left inguinal hernia containing fat and a loculated 5.3 cm fluid collection that may represent a spermatic cord hydrocele. No herniated bowel. 2. Diffusely dilated small bowel with gradual transition to non-dilated distal ileum, favoring severe ileus or partial obstruction. Appearance is similar to prior CT from August 2012. 3. Multiple small layering bladder calculi. 4. Cholelithiasis. Electronically Signed   By: Titus Dubin M.D.   On: 12/26/2018 19:56    Pending Labs Unresulted Labs (From admission, onward)    Start     Ordered   12/27/18 0500  Comprehensive metabolic panel  Tomorrow morning,   STAT     12/26/18 2300   12/27/18 0500  CBC  Tomorrow morning,   STAT     12/26/18 2300          Vitals/Pain Today's Vitals   12/26/18 1609 12/26/18 1613 12/27/18 0101  BP: (!) 167/86  132/85  Pulse: (!) 110  98  Resp: 18  16  Temp: 98.2 F (36.8 C)    TempSrc: Oral    SpO2: 97%  95%  Weight:  80.3 kg    Height:  5\' 8"  (1.727 m)   PainSc:  0-No pain 0-No pain    Isolation Precautions No active isolations  Medications Medications  hydrochlorothiazide (HYDRODIURIL) tablet 25 mg (0 mg Oral Hold 12/27/18 0026)  dutasteride (AVODART) capsule 0.5 mg (has no administration in time range)  enoxaparin (LOVENOX) injection 40 mg (has no administration in time range)  0.9 %  sodium chloride infusion ( Intravenous New Bag/Given 12/27/18 0058)  acetaminophen (TYLENOL) tablet 650 mg (has no administration in time range)    Or  acetaminophen (TYLENOL) suppository 650 mg (has no administration in time range)  HYDROcodone-acetaminophen (NORCO/VICODIN) 5-325 MG per tablet 1-2 tablet (has no administration in time range)  morphine 2 MG/ML injection 2 mg (has no administration in time range)  ondansetron (ZOFRAN-ODT) disintegrating tablet 4 mg (has no administration in time range)    Or  ondansetron (ZOFRAN) injection 4 mg (has no administration in time range)  famotidine (PEPCID) IVPB 20 mg premix (20 mg Intravenous New Bag/Given 12/27/18 0059)  iohexol (OMNIPAQUE) 300 MG/ML solution 30 mL (30 mLs Oral Contrast Given 12/26/18 1655)  iohexol (OMNIPAQUE) 300 MG/ML solution 75 mL (75 mLs Intravenous Contrast Given 12/26/18 1843)    Mobility walks with device Low fall risk   Focused Assessments    R Recommendations: See Admitting Provider Note  Report given to:   Additional Notes:

## 2018-12-27 NOTE — Progress Notes (Signed)
[place] Hospital Day(s): 1.   Post op day(s):  Marland Kitchen   Interval History: Patient seen and examined, no acute events or new complaints overnight. Patient reports feeling better today, denies passing flatus.  Vital signs in last 24 hours: [min-max] current  Temp:  [97.8 F (36.6 C)-99.2 F (37.3 C)] 99.2 F (37.3 C) (02/22 0450) Pulse Rate:  [97-110] 100 (02/22 0450) Resp:  [15-18] 15 (02/22 0450) BP: (132-167)/(84-94) 133/88 (02/22 0450) SpO2:  [93 %-98 %] 95 % (02/22 0450) Weight:  [79 kg-80.3 kg] 79 kg (02/22 0153)     Height: 5\' 8"  (172.7 cm) Weight: 79 kg BMI (Calculated): 26.49   NGT: 150 mL  Physical Exam:  Constitutional: alert, cooperative and no distress  Respiratory: breathing non-labored at rest  Cardiovascular: regular rate and sinus rhythm  Gastrointestinal: soft, non-tender, and distended, panic  Labs:  CBC Latest Ref Rng & Units 12/27/2018 12/26/2018 01/28/2012  WBC 4.0 - 10.5 K/uL 11.7(H) 13.2(H) 7.4  Hemoglobin 13.0 - 17.0 g/dL 15.2 15.3 15.7  Hematocrit 39.0 - 52.0 % 43.6 42.7 46.6  Platelets 150 - 400 K/uL 338 311 236   CMP Latest Ref Rng & Units 12/27/2018 12/26/2018 01/28/2012  Glucose 70 - 99 mg/dL 143(H) 143(H) 66  BUN 8 - 23 mg/dL 55(H) 54(H) 22(H)  Creatinine 0.61 - 1.24 mg/dL 1.67(H) 1.82(H) 1.11  Sodium 135 - 145 mmol/L 138 137 142  Potassium 3.5 - 5.1 mmol/L 3.4(L) 3.5 3.7  Chloride 98 - 111 mmol/L 101 101 104  CO2 22 - 32 mmol/L 25 25 31   Calcium 8.9 - 10.3 mg/dL 8.8(L) 8.9 9.0  Total Protein 6.5 - 8.1 g/dL 6.6 6.6 -  Total Bilirubin 0.3 - 1.2 mg/dL 1.9(H) 1.9(H) -  Alkaline Phos 38 - 126 U/L 45 49 -  AST 15 - 41 U/L 20 20 -  ALT 0 - 44 U/L 13 13 -    Imaging studies: Abdominal x-ray personally reviewed.  There is persistent small bowel dilation.  No free air.   Assessment/Plan:  78 y.o. male with mall bowel obstruction, complicated by pertinent comorbidities including hypertension, BPH, previous right inguinal hernia  repair, umbilical hernia. Continue his small bowel obstruction.  Still not passing flatus.  X-ray with persistent small bowel dilation.  Will continue with conservative management of small bowel obstruction with NGT decompression, IV hydration, and serial physical exams.  If patient does not pass flatus tomorrow we will consider doing a Gastrografin challenge.  Patient encouraged to ambulate.  We will continue DVT prophylaxis.  Arnold Long, MD

## 2018-12-27 NOTE — ED Notes (Signed)
Lights dimmed and pat resting. Pt requesting to have Avocart medication tonight. RN called pharmacy to request medication. Pt updated

## 2018-12-27 NOTE — ED Notes (Signed)
Suction turned off at this time to administer medication. Pts Med-surge nurse aware.

## 2018-12-28 ENCOUNTER — Inpatient Hospital Stay: Payer: Medicare Other

## 2018-12-28 NOTE — Progress Notes (Signed)
[place] Hospital Day(s): 2.   Post op day(s):  Marland Kitchen   Interval History: Patient seen and examined, no acute events or new complaints overnight. Patient reports 3 bowel movement yesterday and is passing gas. denies short vomiting.  Vital signs in last 24 hours: [min-max] current  Temp:  [97.8 F (36.6 C)-98.2 F (36.8 C)] 98.2 F (36.8 C) (02/23 0414) Pulse Rate:  [95-99] 95 (02/23 0414) Resp:  [16-20] 20 (02/23 0414) BP: (146-155)/(86-92) 153/92 (02/23 0414) SpO2:  [93 %-96 %] 96 % (02/23 0414)     Height: 5\' 8"  (172.7 cm) Weight: 79 kg BMI (Calculated): 26.49   NGT: 50 mL  Physical Exam:  Constitutional: alert, cooperative and no distress  Respiratory: breathing non-labored at rest  Cardiovascular: regular rate and sinus rhythm  Gastrointestinal: soft, non-tender, and mildly-distended  Labs:  CBC Latest Ref Rng & Units 12/27/2018 12/26/2018 01/28/2012  WBC 4.0 - 10.5 K/uL 11.7(H) 13.2(H) 7.4  Hemoglobin 13.0 - 17.0 g/dL 15.2 15.3 15.7  Hematocrit 39.0 - 52.0 % 43.6 42.7 46.6  Platelets 150 - 400 K/uL 338 311 236   CMP Latest Ref Rng & Units 12/27/2018 12/26/2018 01/28/2012  Glucose 70 - 99 mg/dL 143(H) 143(H) 66  BUN 8 - 23 mg/dL 55(H) 54(H) 22(H)  Creatinine 0.61 - 1.24 mg/dL 1.67(H) 1.82(H) 1.11  Sodium 135 - 145 mmol/L 138 137 142  Potassium 3.5 - 5.1 mmol/L 3.4(L) 3.5 3.7  Chloride 98 - 111 mmol/L 101 101 104  CO2 22 - 32 mmol/L 25 25 31   Calcium 8.9 - 10.3 mg/dL 8.8(L) 8.9 9.0  Total Protein 6.5 - 8.1 g/dL 6.6 6.6 -  Total Bilirubin 0.3 - 1.2 mg/dL 1.9(H) 1.9(H) -  Alkaline Phos 38 - 126 U/L 45 49 -  AST 15 - 41 U/L 20 20 -  ALT 0 - 44 U/L 13 13 -    Imaging studies: I reviewed the abdominal x-ray.  There is improved small bowel dilation with contrast reaching the large intestine.  The solid gas in the rectum.   Assessment/Plan:  78 y.o.malewith mall bowel obstruction, complicated by pertinent comorbidities includinghypertension, BPH, previous  right inguinal hernia repair, umbilical hernia.  Today with better abdominal physical exam.  He had couple of bowel movements and passing gas.  The x-ray also shows improvement of the small bowel dilation.  Will clamp NGT and see if he tolerates it without nausea.  If he does we will discontinue and start clear liquids.  Arnold Long, MD

## 2018-12-29 MED ORDER — FAMOTIDINE 20 MG PO TABS
20.0000 mg | ORAL_TABLET | Freq: Two times a day (BID) | ORAL | Status: DC
  Administered 2018-12-29 – 2018-12-30 (×2): 20 mg via ORAL
  Filled 2018-12-29 (×2): qty 1

## 2018-12-29 NOTE — Progress Notes (Signed)
[place] Hospital Day(s): 3.   Post op day(s):  Marland Kitchen   Interval History: Patient seen and examined, no acute events or new complaints overnight. Patient reports feeling well.  He reports tolerating clear liquids and had another bowel movement. denies nausea or vomiting.  Vital signs in last 24 hours: [min-max] current  Temp:  [97.3 F (36.3 C)-98 F (36.7 C)] 98 F (36.7 C) (02/24 0354) Pulse Rate:  [91-98] 91 (02/24 0634) Resp:  [20] 20 (02/24 0354) BP: (145-183)/(87-95) 145/87 (02/24 0634) SpO2:  [94 %-96 %] 95 % (02/24 0634)     Height: 5\' 8"  (172.7 cm) Weight: 79 kg BMI (Calculated): 26.49   Physical Exam:  Constitutional: alert, cooperative and no distress  Respiratory: breathing non-labored at rest  Cardiovascular: regular rate and sinus rhythm  Gastrointestinal: soft, non-tender, and non-distended  Labs:  CBC Latest Ref Rng & Units 12/27/2018 12/26/2018 01/28/2012  WBC 4.0 - 10.5 K/uL 11.7(H) 13.2(H) 7.4  Hemoglobin 13.0 - 17.0 g/dL 15.2 15.3 15.7  Hematocrit 39.0 - 52.0 % 43.6 42.7 46.6  Platelets 150 - 400 K/uL 338 311 236   CMP Latest Ref Rng & Units 12/27/2018 12/26/2018 01/28/2012  Glucose 70 - 99 mg/dL 143(H) 143(H) 66  BUN 8 - 23 mg/dL 55(H) 54(H) 22(H)  Creatinine 0.61 - 1.24 mg/dL 1.67(H) 1.82(H) 1.11  Sodium 135 - 145 mmol/L 138 137 142  Potassium 3.5 - 5.1 mmol/L 3.4(L) 3.5 3.7  Chloride 98 - 111 mmol/L 101 101 104  CO2 22 - 32 mmol/L 25 25 31   Calcium 8.9 - 10.3 mg/dL 8.8(L) 8.9 9.0  Total Protein 6.5 - 8.1 g/dL 6.6 6.6 -  Total Bilirubin 0.3 - 1.2 mg/dL 1.9(H) 1.9(H) -  Alkaline Phos 38 - 126 U/L 45 49 -  AST 15 - 41 U/L 20 20 -  ALT 0 - 44 U/L 13 13 -    Imaging studies: No new pertinent imaging studies   Assessment/Plan:  77 y.o.malewith mall bowel obstruction, complicated by pertinent comorbidities includinghypertension, BPH, previous right inguinal hernia repair, umbilical hernia.  Partial small bowel obstruction is resolving.   Patient tolerated liquids.  Will advance to full liquid diet today.  There is no tachycardia or fever.  Patient continue having flatus and bowel movement.  Encouraged to ambulate.  Arnold Long, MD

## 2018-12-29 NOTE — Care Management Important Message (Signed)
Copy of signed Medicare IM left with patient in room. 

## 2018-12-30 NOTE — Discharge Instructions (Signed)
°  Diet: Start with soft diet and advance as you tolerate.   Activity: Increase activity as tolerated. Light activity and walking are encouraged. Do not drive or drink alcohol if taking narcotic pain medications.  Medications: Resume all home medications except . For mild to moderate pain: acetaminophen (Tylenol) or ibuprofen (if no kidney disease).   Call office [phone]) at any time if any questions, worsening pain, fevers/chills, bleeding, drainage from incision site, or other concerns.

## 2018-12-30 NOTE — Progress Notes (Signed)
MD ordered patient to be discharged home.  Discharge instructions were reviewed with the patient and he voiced understanding. Patient instructed about making follow-up appointment if needed. No prescriptions given to the patient.  IV was removed with catheter intact.  All patients questions were answered.

## 2018-12-30 NOTE — Discharge Summary (Addendum)
  Patient ID: [name] MRN: [id number] DOB/AGE: 79-Dec-1942 79 y.o.  Admit date: 12/26/2018 Discharge date: 12/30/2018   Discharge Diagnoses:  Active Problems:   Small bowel obstruction ([address])   Procedures: None  Hospital Course: Patient with small bowel obstruction.  Treated with bowel rest with NGT.  Small bowel obstruction resolved and patient started passing gas and bowel movement.  He tolerated clear liquids a full liquid diet.  Today continue passing gas and having regular bowel movements.  He denies any abdominal pain.  Patient is ambulating.  Physical Exam  Constitutional: He is well-developed, well-nourished, and in no distress.  Cardiovascular: Normal rate.  Pulmonary/Chest: Effort normal.  Abdominal: Soft. Bowel sounds are normal. He exhibits no distension. There is no abdominal tenderness.   Consults: None  Disposition: Home   Allergies as of 12/30/2018      Reactions   Clindamycin/lincomycin Rash   Tachycardia      Medication List    TAKE these medications   ciprofloxacin 500 MG tablet Commonly known as:  CIPRO Take 500 mg by mouth 2 (two) times daily.   dutasteride 0.5 MG capsule Commonly known as:  AVODART Take 1 capsule (0.5 mg total) by mouth at bedtime.   hydrochlorothiazide 25 MG tablet Commonly known as:  HYDRODIURIL Take 1 tablet by mouth at bedtime.   lovastatin 40 MG tablet Commonly known as:  MEVACOR Take 80 mg by mouth at bedtime.   metroNIDAZOLE 500 MG tablet Commonly known as:  FLAGYL Take 500 mg by mouth 3 (three) times daily.       This was a more than 30 minute encounter most of the time orienting patient and coordinating plan of care.

## 2018-12-30 NOTE — Progress Notes (Signed)
Patient left via wheelchair escorted by nursing

## 2019-01-20 ENCOUNTER — Ambulatory Visit: Payer: Self-pay | Admitting: General Surgery

## 2019-01-20 NOTE — H&P (Signed)
PATIENT PROFILE: [name] is a 78 y.o. male who presents to the Clinic for evaluation of left inguinal hernia.  PCP:  Salley Scarlet, MD  HISTORY OF PRESENT ILLNESS: [name] reports he had a recent episode of small bowel obstruction that required admission to the hospital.  Initially it was thought that it was because due to the left inguinal hernia but after I personally evaluated the images it was found that the small bowel was not going through the hernia and was not the cause of the bowel obstruction.  He was treated with conservative management with NGT, bowel rest and IV hydration and small bowel obstruction resolved.  Since then he reports that the left inguinal area continues to bother seem especially when he does heavy lifting.  Pain does not radiate to other part of the body.  Pain is aggravated by heavy lifting.  There is no alleviating factor.  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 Erlene Quan previously.  Due to stability and not causing major symptoms on him, they have decided to observe the hydrocele.  Now that the left inguinal area is bothering him, fixing the hernia and the hydrocele has been discussed with patient and with Dr. Hassan Rowan.   PROBLEM LIST:         Problem List  Date Reviewed: 08/23/2014         Noted   Adenomatous polyp of colon, unspecified 08/23/2014      GENERAL REVIEW OF SYSTEMS:   General ROS: negative for - chills, fatigue, fever, weight gain or weight loss Allergy and Immunology ROS: negative for - hives  Hematological and Lymphatic ROS: negative for - bleeding problems or bruising, negative for palpable nodes Endocrine ROS: negative for - heat or cold intolerance, hair changes Respiratory ROS: negative for - cough, shortness of breath or wheezing Cardiovascular ROS: no chest pain or palpitations GI ROS: Positive for recent episode of nausea,  vomiting, abdominal pain, constipation.  Negative for diarrhea Musculoskeletal ROS: negative for - joint swelling or muscle pain Neurological ROS: negative for - confusion, syncope Dermatological ROS: negative for pruritus and rash Psychiatric: negative for anxiety, depression, difficulty sleeping and memory loss  MEDICATIONS: CurrentMedications        Current Outpatient Medications  Medication Sig Dispense Refill  . dutasteride (AVODART) 0.5 mg capsule Take 0.5 mg by mouth once daily    . hydrochlorothiazide (HYDRODIURIL) 25 MG tablet Take 25 mg by mouth once daily     2  . lovastatin (MEVACOR) 40 MG tablet Take 80 mg by mouth daily with dinner     2   No current facility-administered medications for this visit.       ALLERGIES: Clindamycin  PAST MEDICAL HISTORY:     Past Medical History:  Diagnosis Date  . Colon polyp 09/06/14   Tubular adenoma  . Diverticulosis 09/06/14  . Hypercholesterolemia   . Hypertension     PAST SURGICAL HISTORY:      Past Surgical History:  Procedure Laterality Date  . APPENDECTOMY  2012  . COLONOSCOPY  05/30/2009   02/12/2005 (5-mm adenomatous polyp)  . COLONOSCOPY  02/12/2005  . COLONOSCOPY  05/30/2009  . COLONOSCOPY  09/06/14   Tubular Adenoma/Repeat 10yrs/MUS  . FISTULOTOMY ANAL  2010  . INGUINAL HERNIA REPAIR Right 2013  . SIGMOIDOSCOPY  11/01/1999     FAMILY HISTORY:      Family History  Problem Relation Age of Onset  . Stroke Mother   . Prostate cancer Father   . Colon cancer Neg Hx   . Colon polyps Neg Hx   . Liver disease Neg Hx   . Rectal cancer Neg Hx   . Ulcers Neg Hx      SOCIAL HISTORY: Social History          Socioeconomic History  . Marital status: Married    Spouse name: Not on file  . Number of children: Not on file  . Years of education: Not on file  . Highest education level: Not on file  Occupational History  . Not on file  Social Needs  . Financial resource  strain: Not on file  . Food insecurity:    Worry: Not on file    Inability: Not on file  . Transportation needs:    Medical: Not on file    Non-medical: Not on file  Tobacco Use  . Smoking status: Never Smoker  . Smokeless tobacco: Never Used  Substance and Sexual Activity  . Alcohol use: No  . Drug use: No  . Sexual activity: Defer  Other Topics Concern  . Not on file  Social History Narrative  . Not on file      PHYSICAL EXAM:    Vitals:   01/20/19 1318  BP: 153/88  Pulse: 106   Body mass index is 27.52 kg/m.     GENERAL: Alert, active, oriented x3  HEENT: Pupils equal reactive to light. Extraocular movements are intact. Sclera clear. Palpebral conjunctiva normal red color.Pharynx clear.  NECK: Supple with no palpable mass and no adenopathy.  LUNGS: Sound clear with no rales rhonchi or wheezes.  HEART: Regular rhythm S1 and S2 without murmur.  ABDOMEN: Soft and depressible, nontender with no palpable mass, no hepatomegaly.  There is a left inguinal hernia, incarcerated, not painful.  The abdomen is nondistended.  There is a umbilical hernia that is reducible.  EXTREMITIES: Well-developed well-nourished symmetrical with no dependent edema.  NEUROLOGICAL: Awake alert oriented, facial expression symmetrical, moving all extremities.  REVIEW OF DATA: I have reviewed the following data today: No visits with results within 3 Month(s) from this visit.  Latest known visit with results is:  No results found for any previous visit.  I personally reviewed the images of the abdomen and pelvis CT scan done in [facility].  There was small bowel dilation with transition point in the right lower quadrant.  There was no bowel going through the left inguinal hernia.  There is a large hydrocele, noncommunicating.  ASSESSMENT: [name] is a 78 y.o. male presenting for evaluation of left inguinal hernia.    The patient presents with a symptomatic,  incarcerated left inguinal hernia. Patient was oriented about the diagnosis of inguinal hernia and its implication. The patient was oriented about the treatment alternatives (observation vs surgical repair). Due to patient symptoms, repair is recommended. Patient oriented about the surgical procedure, the use of mesh and its risk of complications such as: infection, bleeding, injury to vas deference, vasculature and testicle, injury to bowel or bladder, and chronic pain.  Since patient has a large hydrocele on the same side which can be also causing symptoms on these patient, this was discussed with Dr. Erlene Quan to perform excision of the hydrocele on the same time of the surgery.  She reports that she agreed with the plan.  I discussed with the patient about the benefit of having the hydrocele removed at the  same time and he agrees to proceed with the excision of the hydrocele at the same time of the surgery.  The umbilical hernia at this moment is not symptomatic so we will defer repair at this moment.  Patient wants to continue observation and does not want to have a repair at this moment.  Patient oriented about the signs and symptoms worrisome of incarceration and or strangulation.  He was oriented to go to the ED if he has any of the symptoms.  Non-recurrent unilateral inguinal hernia without obstruction or gangrene [K40.90]  PLAN: 1. Repair of left inguinal hernia (09323) 2. Excision of left testicle hydrocele by Dr. Erlene Quan 3. CBC, CMP 4. Internal medicine clearance 5.  Avoid taking aspirin 5 days before surgery 6. Contact us if has any question or concern.   Patient verbalized understanding, all questions were answered, and were agreeable with the plan outlined above.    Herbert Pun, MD  Electronically signed by Herbert Pun, MD

## 2019-02-09 ENCOUNTER — Ambulatory Visit: Admission: RE | Admit: 2019-02-09 | Payer: Medicare Other | Source: Home / Self Care | Admitting: General Surgery

## 2019-02-09 ENCOUNTER — Encounter: Admission: RE | Payer: Self-pay | Source: Home / Self Care

## 2019-02-09 SURGERY — REPAIR, HERNIA, INGUINAL, ADULT
Anesthesia: General | Laterality: Left

## 2019-04-09 ENCOUNTER — Other Ambulatory Visit: Payer: Self-pay | Admitting: Radiology

## 2019-04-09 ENCOUNTER — Telehealth: Payer: Self-pay | Admitting: Radiology

## 2019-04-09 NOTE — Telephone Encounter (Signed)
LMOM to return call. Need to discuss upcoming surgery with Dr Erlene Quan for hydrocelectomy.

## 2019-04-14 NOTE — Telephone Encounter (Signed)
Patient would like to discuss hydrocelectomy with Dr Erlene Quan. Appointment made.

## 2019-04-20 ENCOUNTER — Other Ambulatory Visit: Payer: Self-pay | Admitting: Urology

## 2019-04-21 ENCOUNTER — Encounter: Payer: Self-pay | Admitting: Urology

## 2019-04-21 ENCOUNTER — Ambulatory Visit (INDEPENDENT_AMBULATORY_CARE_PROVIDER_SITE_OTHER): Payer: Medicare Other | Admitting: Urology

## 2019-04-21 ENCOUNTER — Other Ambulatory Visit: Payer: Self-pay

## 2019-04-21 VITALS — BP 218/107 | HR 97 | Ht 68.0 in | Wt 171.0 lb

## 2019-04-21 DIAGNOSIS — R972 Elevated prostate specific antigen [PSA]: Secondary | ICD-10-CM

## 2019-04-21 DIAGNOSIS — N433 Hydrocele, unspecified: Secondary | ICD-10-CM | POA: Diagnosis not present

## 2019-04-21 NOTE — Progress Notes (Signed)
04/21/2019 4:19 PM   [name] 1940-12-15 [id number]  Referring provider: Marguerita Merles, [address] [place],  [place] [address]  Chief Complaint  Patient presents with  . Hydrocele    HPI: 78 year old male with personal history of elevated PSA as well as bilateral hydroceles who returns to the office today to discuss possible upcoming hernia/hydrocele surgery.  He was originally scheduled to undergo left inguinal hernia repair with Dr. Windell Moment at which time I was asked to do concomitant left hydrocelectomy.  Surgery has been delayed secondary to cancellation of elective surgeries in light of COVID-19 pandemic.  Now that surgeries have been resumed, he was initially scheduled to have surgery again but canceled due to concerns about possible rectal fistula.  He was under the impression that I can address that today for him.  In terms of his hydrocele, he has been noted to have this since 2016.  He is a scrotal ultrasound confirming the findings.  His right hydrocele is larger than his left.  Currently, his hydroceles are asymptomatic.  He reports during a hospital admission when he was on fluids, his scrotum was larger but this is now gone back down to his normal size, unchanged for the past several years.   PMH: Past Medical History:  Diagnosis Date  . Acute appendicitis    perforated  . Anal fistula   . Chronic kidney disease   . Diverticulitis   . Hemorrhoids, internal   . Hypertension   . Inguinal hernia     Surgical History: Past Surgical History:  Procedure Laterality Date  . colonoscopy 2010    . HERNIA REPAIR  02/04/2012   Dr. Burt Knack  . INGUINAL HERNIA REPAIR  2013   Dr. Burt Knack  . LAPAROSCOPIC APPENDECTOMY  07/03/2011   Dr Burt Knack  . RECTAL SURGERY  08/08/2009   Dr. Pat Patrick    Home Medications:  Allergies as of 04/21/2019      Reactions   Clindamycin/lincomycin Rash   Tachycardia      Medication List       Accurate as of April 21, 2019   4:19 PM. If you have any questions, ask your nurse or doctor.        STOP taking these medications   ciprofloxacin 500 MG tablet Commonly known as: CIPRO Stopped by: Hollice Espy, MD   metroNIDAZOLE 500 MG tablet Commonly known as: FLAGYL Stopped by: Hollice Espy, MD     TAKE these medications   dutasteride 0.5 MG capsule Commonly known as: AVODART TAKE 1 CAPSULE BY MOUTH AT BEDTIME   hydrochlorothiazide 25 MG tablet Commonly known as: HYDRODIURIL Take 1 tablet by mouth at bedtime.   lovastatin 40 MG tablet Commonly known as: MEVACOR Take 80 mg by mouth at bedtime.       Allergies:  Allergies  Allergen Reactions  . Clindamycin/Lincomycin Rash    Tachycardia     Family History: Family History  Problem Relation Age of Onset  . Hypertension Mother   . Cancer Father        prostate  . Hypertension Father   . Kidney disease Neg Hx   . Kidney cancer Neg Hx   . Bladder Cancer Neg Hx     Social History:  reports that he has never smoked. He has never used smokeless tobacco. He reports that he does not drink alcohol or use drugs.  ROS: UROLOGY Frequent Urination?: No Hard to postpone urination?: No Burning/pain with urination?: No Get up at night  to urinate?: No Leakage of urine?: No Urine stream starts and stops?: No Trouble starting stream?: No Do you have to strain to urinate?: No Blood in urine?: No Urinary tract infection?: No Sexually transmitted disease?: No Injury to kidneys or bladder?: No Painful intercourse?: No Weak stream?: No Erection problems?: No Penile pain?: No  Gastrointestinal Nausea?: No Vomiting?: No Indigestion/heartburn?: No Diarrhea?: No Constipation?: No  Constitutional Fever: No Night sweats?: No Weight loss?: No Fatigue?: No  Skin Skin rash/lesions?: No Itching?: No  Eyes Blurred vision?: No Double vision?: No  Ears/Nose/Throat Sore throat?: No Sinus problems?: No  Hematologic/Lymphatic Swollen  glands?: No Easy bruising?: No  Cardiovascular Leg swelling?: No Chest pain?: No  Respiratory Cough?: No Shortness of breath?: No  Endocrine Excessive thirst?: No  Musculoskeletal Back pain?: No Joint pain?: No  Neurological Headaches?: No Dizziness?: No  Psychologic Depression?: No Anxiety?: No  Physical Exam: BP (!) 218/107   Pulse 97   Ht 5\' 8"  (1.727 m)   Wt 171 lb (77.6 kg)   BMI 26.00 kg/m   Constitutional:  Alert and oriented, No acute distress. HEENT: [place] AT, moist mucus membranes.  Trachea midline, no masses. Cardiovascular: No clubbing, cyanosis, or edema. Respiratory: Normal respiratory effort, no increased work of breathing. GI: Abdomen is soft, nontender, nondistended, no abdominal masses GU: No CVA tenderness.  Normal descended testicles.  Right hydrocele is approximately limited size, left is approximately lime size.  Testicles palpable within the hydrocele sacs which are not tense.  Nonpainful.  No palpable masses. Skin: No rashes, bruises or suspicious lesions. Neurologic: Grossly intact, no focal deficits, moving all 4 extremities. Psychiatric: Normal mood and affect.  Laboratory Data: Lab Results  Component Value Date   WBC 11.7 (H) 12/27/2018   HGB 15.2 12/27/2018   HCT 43.6 12/27/2018   MCV 82.0 12/27/2018   PLT 338 12/27/2018    Lab Results  Component Value Date   CREATININE 1.67 (H) 12/27/2018    Lab Results  Component Value Date   PSA 8.4 04/14/2018    Assessment & Plan:    1. Elevated PSA Due for repeat PSA in 08/2019 See previous notes for details Keep follow-up as scheduled  2. Hydrocele, bilateral At this point time, he is canceled his inguinal hernia surgery and will discuss this further with Dr. Levi Aland to call him for management of perirectal fistula Clinically, his left hydrocele is quite small and asymptomatic As such, I do not feel that this needs to be drained and may cause more harm than benefit  He is agreeable this plan We will continue to follow clinically  F/u as previously scheduled  Hollice Espy, MD  [facility] [address] [place], [place] [address] [phone]

## 2019-04-28 ENCOUNTER — Ambulatory Visit: Payer: Self-pay | Admitting: General Surgery

## 2019-04-28 NOTE — H&P (Signed)
PATIENT PROFILE: [name] is a 78 y.o. male who presents to the Clinic for evaluation of left inguinal hernia.  PCP:  Salley Scarlet, MD  HISTORY OF PRESENT ILLNESS: [name] reports having worsening symptoms on the left inguinal hernia.  He reports that it is getting more painful.  He reports that pain does not radiate to other part of the body.  Pain aggravates with physical activity.  Pain improved with resting and laying down.  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 Pat Patrick.  The last 2 weeks he has been having persistent drainage in the perianal area.  Denies pain or bleeding.  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  He was evaluated recently by his urologist and it was assessed that the hydroceles were small and should not be treated at the same time of the inguinal hernia repair.   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      GENERAL REVIEW OF SYSTEMS:   General ROS: negative for - chills, fatigue, fever, weight gain or weight loss Allergy and Immunology ROS: negative for - hives  Hematological and Lymphatic ROS: negative for - bleeding problems or bruising, negative for palpable nodes Endocrine ROS: negative for - heat or cold intolerance, hair changes Respiratory ROS: negative for - cough, shortness of breath or wheezing Cardiovascular ROS: no chest pain or palpitations GI ROS: negative for nausea, vomiting, abdominal pain, diarrhea, constipation Musculoskeletal ROS: negative for - joint swelling or muscle pain Neurological ROS: negative for - confusion, syncope Dermatological ROS: negative for pruritus and rash Psychiatric: negative for anxiety, depression, difficulty sleeping and memory loss  MEDICATIONS: Current Outpatient Medications  Medication Sig Dispense Refill  . dutasteride  (AVODART) 0.5 mg capsule Take 0.5 mg by mouth once daily    . hydrochlorothiazide (HYDRODIURIL) 25 MG tablet Take 25 mg by mouth once daily     2  . lovastatin (MEVACOR) 40 MG tablet Take 80 mg by mouth daily with dinner     2  . ciprofloxacin HCl (CIPRO) 500 MG tablet Take 1 tablet (500 mg total) by mouth 2 (two) times daily for 10 days 20 tablet 0  . metroNIDAZOLE (FLAGYL) 500 MG tablet Take 1 tablet (500 mg total) by mouth 3 (three) times daily for 10 days 30 tablet 0   No current facility-administered medications for this visit.     ALLERGIES: Clindamycin  PAST MEDICAL HISTORY: Past Medical History:  Diagnosis Date  . Colon polyp 09/06/14   Tubular adenoma  . Diverticulosis 09/06/14  . Hypercholesterolemia   . Hypertension     PAST SURGICAL HISTORY: Past Surgical History:  Procedure Laterality Date  . APPENDECTOMY  2012  . COLONOSCOPY  05/30/2009   02/12/2005 (5-mm adenomatous polyp)  . COLONOSCOPY  02/12/2005  . COLONOSCOPY  05/30/2009  . COLONOSCOPY  09/06/14   Tubular Adenoma/Repeat 19yr/MUS  . FISTULOTOMY ANAL  2010  . INGUINAL HERNIA REPAIR Right 2013  . SIGMOIDOSCOPY  11/01/1999     FAMILY HISTORY: Family History  Problem Relation Age of Onset  . Stroke Mother   . Prostate cancer Father   . Colon cancer Neg Hx   . Colon polyps Neg Hx   . Liver disease Neg Hx   . Rectal cancer Neg Hx   . Ulcers Neg Hx  SOCIAL HISTORY: Social History   Socioeconomic History  . Marital status: Married    Spouse name: Not on file  . Number of children: Not on file  . Years of education: Not on file  . Highest education level: Not on file  Occupational History  . Not on file  Social Needs  . Financial resource strain: Not on file  . Food insecurity:    Worry: Not on file    Inability: Not on file  . Transportation needs:    Medical: Not on file    Non-medical: Not on file  Tobacco Use  . Smoking status: Never Smoker  . Smokeless tobacco: Never Used   Substance and Sexual Activity  . Alcohol use: No  . Drug use: No  . Sexual activity: Defer  Other Topics Concern  . Not on file  Social History Narrative  . Not on file    PHYSICAL EXAM: Vitals:   04/28/19 1408  BP: (!) 160/94  Pulse: 86   Body mass index is 27.52 kg/m. Weight: 82.1 kg (181 lb)   GENERAL: Alert, active, oriented x3  HEENT: Pupils equal reactive to light. Extraocular movements are intact. Sclera clear. Palpebral conjunctiva normal red color.Pharynx clear.  NECK: Supple with no palpable mass and no adenopathy.  LUNGS: Sound clear with no rales rhonchi or wheezes.  HEART: Regular rhythm S1 and S2 without murmur.  ABDOMEN: Soft and depressible, nontender with no palpable mass, no hepatomegaly.  Reducible left inguinal hernia, minimally painful.  On standing position significant bulging on the left inguinal area.  Genitourinary: Normal right testicle with significantly decreasing the hydrocele since last evaluation.  EXTREMITIES: Well-developed well-nourished symmetrical with no dependent edema.  NEUROLOGICAL: Awake alert oriented, facial expression symmetrical, moving all extremities.  REVIEW OF DATA: I have reviewed the following data today: No visits with results within 3 Month(s) from this visit.  Latest known visit with results is:  Office Visit on 01/20/2019  Component Date Value  . Glucose 01/20/2019 87   . Sodium 01/20/2019 140   . Potassium 01/20/2019 4.5   . Chloride 01/20/2019 100   . Carbon Dioxide (CO2) 01/20/2019 32.3*  . Urea Nitrogen (BUN) 01/20/2019 17   . Creatinine 01/20/2019 1.2   . Glomerular Filtration Ra* 01/20/2019 71   . Calcium 01/20/2019 9.4   . AST  01/20/2019 31   . ALT  01/20/2019 32   . Alk Phos (alkaline Phosp* 01/20/2019 53   . Albumin 01/20/2019 3.5   . Bilirubin, Total 01/20/2019 0.6   . Protein, Total 01/20/2019 7.0   . A/G Ratio 01/20/2019 1.0   . WBC (White Blood Cell Co* 01/20/2019 9.7   . RBC (Red Blood  Cell Coun* 01/20/2019 4.18*  . Hemoglobin 01/20/2019 11.7*  . Hematocrit 01/20/2019 34.5*  . MCV (Mean Corpuscular Vo* 01/20/2019 82.5   . MCH (Mean Corpuscular He* 01/20/2019 28.0   . MCHC (Mean Corpuscular H* 01/20/2019 33.9   . Platelet Count 01/20/2019 482*  . RDW-CV (Red Cell Distrib* 01/20/2019 13.2   . MPV (Mean Platelet Volum* 01/20/2019 9.1*  . Neutrophils 01/20/2019 6.71   . Lymphocytes 01/20/2019 1.49   . Monocytes 01/20/2019 1.23   . Eosinophils 01/20/2019 0.21   . Basophils 01/20/2019 0.07   . Neutrophil % 01/20/2019 68.9   . Lymphocyte % 01/20/2019 15.3   . Monocyte % 01/20/2019 12.6   . Eosinophil % 01/20/2019 2.2   . Basophil% 01/20/2019 0.7   . Immature Granulocyte % 01/20/2019 0.3   .  Immature Granulocyte Cou* 01/20/2019 0.03      ASSESSMENT: Mr. Gago is a 78 y.o. male presenting for consultation for left inguinal hernia.    The patient presents with a symptomatic, reducible inguinal hernia. Patient was oriented about the diagnosis of inguinal hernia and its implication. The patient was oriented about the treatment alternatives (observation vs surgical repair). Due to patient symptoms, repair is recommended. Patient oriented about the surgical procedure, the use of mesh and its risk of complications such as: infection, bleeding, injury to vas deference, vasculature and testicle, injury to bowel or bladder, and chronic pain.   Patient also with bilateral hydrocele that are currently asymptomatic.  He was evaluated by urologist for possible concomitant surgery of inguinal hernia repair and hydrocele excision but it was assessed that the patient does not need excision of the hydrocele at this moment.  As per urology may proceed with inguinal hernia with a hydrocele treatment.  Patient also with perianal fistula.  Currently with minimal drainage and no sign of infection.  I discussed with patient that both surgery cannot be done at the same time and he reports that  the drainage is minimally bothering him and there is no pain.  He does report that he has significant pain on the inguinal area when the hernia bulged out.  He also had a recent episode of small bowel obstruction even though it was not related to the inguinal hernia affects his area due to the pressure.  When asked the patient whether it was anymore he reports that the inguinal hernia and he want that to be addressed first.  I discussed with him that I do not do surgery for perianal fistula so he will need to be referred to a colorectal surgeon.  Non-recurrent unilateral inguinal hernia without obstruction or gangrene [K40.90]  PLAN: 1. Left inguinal hernia repair with mesh (70110) 2. Avoid taking aspirin 5 days before surgery.  3. CBC, CMP 4. Internal medicine clearance.  5. Contact us if has any question or concern.   Patient verbalized understanding, all questions were answered, and were agreeable with the plan outlined above.    Herbert Pun, MD  Electronically signed by Herbert Pun, MD

## 2019-04-28 NOTE — H&P (View-Only) (Signed)
PATIENT PROFILE: [name] is a 78 y.o. male who presents to the Clinic for evaluation of left inguinal hernia.  PCP:  Salley Scarlet, MD  HISTORY OF PRESENT ILLNESS: [name] reports having worsening symptoms on the left inguinal hernia.  He reports that it is getting more painful.  He reports that pain does not radiate to other part of the body.  Pain aggravates with physical activity.  Pain improved with resting and laying down.  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 Pat Patrick.  The last 2 weeks he has been having persistent drainage in the perianal area.  Denies pain or bleeding.  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  He was evaluated recently by his urologist and it was assessed that the hydroceles were small and should not be treated at the same time of the inguinal hernia repair.   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      GENERAL REVIEW OF SYSTEMS:   General ROS: negative for - chills, fatigue, fever, weight gain or weight loss Allergy and Immunology ROS: negative for - hives  Hematological and Lymphatic ROS: negative for - bleeding problems or bruising, negative for palpable nodes Endocrine ROS: negative for - heat or cold intolerance, hair changes Respiratory ROS: negative for - cough, shortness of breath or wheezing Cardiovascular ROS: no chest pain or palpitations GI ROS: negative for nausea, vomiting, abdominal pain, diarrhea, constipation Musculoskeletal ROS: negative for - joint swelling or muscle pain Neurological ROS: negative for - confusion, syncope Dermatological ROS: negative for pruritus and rash Psychiatric: negative for anxiety, depression, difficulty sleeping and memory loss  MEDICATIONS: Current Outpatient Medications  Medication Sig Dispense Refill  . dutasteride  (AVODART) 0.5 mg capsule Take 0.5 mg by mouth once daily    . hydrochlorothiazide (HYDRODIURIL) 25 MG tablet Take 25 mg by mouth once daily     2  . lovastatin (MEVACOR) 40 MG tablet Take 80 mg by mouth daily with dinner     2  . ciprofloxacin HCl (CIPRO) 500 MG tablet Take 1 tablet (500 mg total) by mouth 2 (two) times daily for 10 days 20 tablet 0  . metroNIDAZOLE (FLAGYL) 500 MG tablet Take 1 tablet (500 mg total) by mouth 3 (three) times daily for 10 days 30 tablet 0   No current facility-administered medications for this visit.     ALLERGIES: Clindamycin  PAST MEDICAL HISTORY: Past Medical History:  Diagnosis Date  . Colon polyp 09/06/14   Tubular adenoma  . Diverticulosis 09/06/14  . Hypercholesterolemia   . Hypertension     PAST SURGICAL HISTORY: Past Surgical History:  Procedure Laterality Date  . APPENDECTOMY  2012  . COLONOSCOPY  05/30/2009   02/12/2005 (5-mm adenomatous polyp)  . COLONOSCOPY  02/12/2005  . COLONOSCOPY  05/30/2009  . COLONOSCOPY  09/06/14   Tubular Adenoma/Repeat 54yr/MUS  . FISTULOTOMY ANAL  2010  . INGUINAL HERNIA REPAIR Right 2013  . SIGMOIDOSCOPY  11/01/1999     FAMILY HISTORY: Family History  Problem Relation Age of Onset  . Stroke Mother   . Prostate cancer Father   . Colon cancer Neg Hx   . Colon polyps Neg Hx   . Liver disease Neg Hx   . Rectal cancer Neg Hx   . Ulcers Neg Hx  SOCIAL HISTORY: Social History   Socioeconomic History  . Marital status: Married    Spouse name: Not on file  . Number of children: Not on file  . Years of education: Not on file  . Highest education level: Not on file  Occupational History  . Not on file  Social Needs  . Financial resource strain: Not on file  . Food insecurity:    Worry: Not on file    Inability: Not on file  . Transportation needs:    Medical: Not on file    Non-medical: Not on file  Tobacco Use  . Smoking status: Never Smoker  . Smokeless tobacco: Never Used   Substance and Sexual Activity  . Alcohol use: No  . Drug use: No  . Sexual activity: Defer  Other Topics Concern  . Not on file  Social History Narrative  . Not on file    PHYSICAL EXAM: Vitals:   04/28/19 1408  BP: (!) 160/94  Pulse: 86   Body mass index is 27.52 kg/m. Weight: 82.1 kg (181 lb)   GENERAL: Alert, active, oriented x3  HEENT: Pupils equal reactive to light. Extraocular movements are intact. Sclera clear. Palpebral conjunctiva normal red color.Pharynx clear.  NECK: Supple with no palpable mass and no adenopathy.  LUNGS: Sound clear with no rales rhonchi or wheezes.  HEART: Regular rhythm S1 and S2 without murmur.  ABDOMEN: Soft and depressible, nontender with no palpable mass, no hepatomegaly.  Reducible left inguinal hernia, minimally painful.  On standing position significant bulging on the left inguinal area.  Genitourinary: Normal right testicle with significantly decreasing the hydrocele since last evaluation.  EXTREMITIES: Well-developed well-nourished symmetrical with no dependent edema.  NEUROLOGICAL: Awake alert oriented, facial expression symmetrical, moving all extremities.  REVIEW OF DATA: I have reviewed the following data today: No visits with results within 3 Month(s) from this visit.  Latest known visit with results is:  Office Visit on 01/20/2019  Component Date Value  . Glucose 01/20/2019 87   . Sodium 01/20/2019 140   . Potassium 01/20/2019 4.5   . Chloride 01/20/2019 100   . Carbon Dioxide (CO2) 01/20/2019 32.3*  . Urea Nitrogen (BUN) 01/20/2019 17   . Creatinine 01/20/2019 1.2   . Glomerular Filtration Ra* 01/20/2019 71   . Calcium 01/20/2019 9.4   . AST  01/20/2019 31   . ALT  01/20/2019 32   . Alk Phos (alkaline Phosp* 01/20/2019 53   . Albumin 01/20/2019 3.5   . Bilirubin, Total 01/20/2019 0.6   . Protein, Total 01/20/2019 7.0   . A/G Ratio 01/20/2019 1.0   . WBC (White Blood Cell Co* 01/20/2019 9.7   . RBC (Red Blood  Cell Coun* 01/20/2019 4.18*  . Hemoglobin 01/20/2019 11.7*  . Hematocrit 01/20/2019 34.5*  . MCV (Mean Corpuscular Vo* 01/20/2019 82.5   . MCH (Mean Corpuscular He* 01/20/2019 28.0   . MCHC (Mean Corpuscular H* 01/20/2019 33.9   . Platelet Count 01/20/2019 482*  . RDW-CV (Red Cell Distrib* 01/20/2019 13.2   . MPV (Mean Platelet Volum* 01/20/2019 9.1*  . Neutrophils 01/20/2019 6.71   . Lymphocytes 01/20/2019 1.49   . Monocytes 01/20/2019 1.23   . Eosinophils 01/20/2019 0.21   . Basophils 01/20/2019 0.07   . Neutrophil % 01/20/2019 68.9   . Lymphocyte % 01/20/2019 15.3   . Monocyte % 01/20/2019 12.6   . Eosinophil % 01/20/2019 2.2   . Basophil% 01/20/2019 0.7   . Immature Granulocyte % 01/20/2019 0.3   .  Immature Granulocyte Cou* 01/20/2019 0.03      ASSESSMENT: Mr. Ra is a 78 y.o. male presenting for consultation for left inguinal hernia.    The patient presents with a symptomatic, reducible inguinal hernia. Patient was oriented about the diagnosis of inguinal hernia and its implication. The patient was oriented about the treatment alternatives (observation vs surgical repair). Due to patient symptoms, repair is recommended. Patient oriented about the surgical procedure, the use of mesh and its risk of complications such as: infection, bleeding, injury to vas deference, vasculature and testicle, injury to bowel or bladder, and chronic pain.   Patient also with bilateral hydrocele that are currently asymptomatic.  He was evaluated by urologist for possible concomitant surgery of inguinal hernia repair and hydrocele excision but it was assessed that the patient does not need excision of the hydrocele at this moment.  As per urology may proceed with inguinal hernia with a hydrocele treatment.  Patient also with perianal fistula.  Currently with minimal drainage and no sign of infection.  I discussed with patient that both surgery cannot be done at the same time and he reports that  the drainage is minimally bothering him and there is no pain.  He does report that he has significant pain on the inguinal area when the hernia bulged out.  He also had a recent episode of small bowel obstruction even though it was not related to the inguinal hernia affects his area due to the pressure.  When asked the patient whether it was anymore he reports that the inguinal hernia and he want that to be addressed first.  I discussed with him that I do not do surgery for perianal fistula so he will need to be referred to a colorectal surgeon.  Non-recurrent unilateral inguinal hernia without obstruction or gangrene [K40.90]  PLAN: 1. Left inguinal hernia repair with mesh (69450) 2. Avoid taking aspirin 5 days before surgery.  3. CBC, CMP 4. Internal medicine clearance.  5. Contact us if has any question or concern.   Patient verbalized understanding, all questions were answered, and were agreeable with the plan outlined above.    Herbert Pun, MD  Electronically signed by Herbert Pun, MD

## 2019-05-01 ENCOUNTER — Other Ambulatory Visit: Payer: Self-pay

## 2019-05-01 ENCOUNTER — Encounter
Admission: RE | Admit: 2019-05-01 | Discharge: 2019-05-01 | Disposition: A | Discharge status: Home / Self Care | Payer: Medicare Other | Source: Ambulatory Visit | Attending: General Surgery | Admitting: General Surgery

## 2019-05-01 DIAGNOSIS — Z01810 Encounter for preprocedural cardiovascular examination: Secondary | ICD-10-CM | POA: Diagnosis not present

## 2019-05-01 DIAGNOSIS — Z1159 Encounter for screening for other viral diseases: Secondary | ICD-10-CM | POA: Diagnosis not present

## 2019-05-01 NOTE — Pre-Procedure Instructions (Signed)
EKG OK BY DR PENWARDEN 

## 2019-05-01 NOTE — Patient Instructions (Signed)
Your procedure is scheduled on:Wed. 7/1 Report to Day Surgery. To find out your arrival time please call [phone] between 1PM - 3PM on Tues 6/30.  Remember: Instructions that are not followed completely may result in serious medical risk,  up to and including death, or upon the discretion of your surgeon and anesthesiologist your  surgery may need to be rescheduled.     _X__ 1. Do not eat food after midnight the night before your procedure.                 No gum chewing or hard candies. You may drink clear liquids up to 2 hours                 before you are scheduled to arrive for your surgery- DO not drink clear                 liquids within 2 hours of the start of your surgery.                 Clear Liquids include:  water, apple juice without pulp, clear carbohydrate                 drink such as Clearfast of Gatorade, Black Coffee or Tea (Do not add                 anything to coffee or tea).  __X__2.  On the morning of surgery brush your teeth with toothpaste and water, you                may rinse your mouth with mouthwash if you wish.  Do not swallow any toothpaste of mouthwash.     _X__ 3.  No Alcohol for 24 hours before or after surgery.   ___ 4.  Do Not Smoke or use e-cigarettes For 24 Hours Prior to Your Surgery.                 Do not use any chewable tobacco products for at least 6 hours prior to                 surgery.  ____  5.  Bring all medications with you on the day of surgery if instructed.   __x__  6.  Notify your doctor if there is any change in your medical condition      (cold, fever, infections).     Do not wear jewelry, make-up, hairpins, clips or nail polish. Do not wear lotions, powders, or perfumes. You may wear deodorant. Do not shave 48 hours prior to surgery. Men may shave face and neck. Do not bring valuables to the hospital.    [facility] is not responsible for any belongings or valuables.  Contacts, dentures or  bridgework may not be worn into surgery. Leave your suitcase in the car. After surgery it may be brought to your room. For patients admitted to the hospital, discharge time is determined by your treatment team.   Patients discharged the day of surgery will not be allowed to drive home.   Please read over the following fact sheets that you were given:    __x__ Take these medicines the morning of surgery with A SIP OF WATER:    1. Both antibiotics  2.   3.   4.  5.  6.  ____ Fleet Enema (as directed)   __x__ Use CHG Soap as directed  ____ Use inhalers on the day of  surgery  ____ Stop metformin 2 days prior to surgery    ____ Take 1/2 of usual insulin dose the night before surgery. No insulin the morning          of surgery.   ____ Stop Coumadin/Plavix/aspirin on  _x___ Stop Anti-inflammatories ibuprofen aleve or aspirin   May take tylenol   ____ Stop supplements until after surgery.    ____ Bring C-Pap to the hospital.

## 2019-05-02 LAB — NOVEL CORONAVIRUS, NAA (HOSP ORDER, SEND-OUT TO REF LAB; TAT 18-24 HRS): SARS-CoV-2, NAA: NOT DETECTED

## 2019-05-05 MED ORDER — CEFAZOLIN SODIUM-DEXTROSE 2-4 GM/100ML-% IV SOLN
2.0000 g | INTRAVENOUS | Status: AC
  Administered 2019-05-06: 08:00:00 2 g via INTRAVENOUS

## 2019-05-06 ENCOUNTER — Ambulatory Visit
Admission: RE | Admit: 2019-05-06 | Discharge: 2019-05-06 | Disposition: A | Discharge status: Home / Self Care | Payer: Medicare Other | Attending: General Surgery | Admitting: General Surgery

## 2019-05-06 ENCOUNTER — Other Ambulatory Visit: Payer: Self-pay

## 2019-05-06 ENCOUNTER — Ambulatory Visit: Payer: Medicare Other | Admitting: Anesthesiology

## 2019-05-06 ENCOUNTER — Encounter: Payer: Self-pay | Admitting: Anesthesiology

## 2019-05-06 ENCOUNTER — Encounter
Admission: RE | Disposition: A | Discharge status: Home / Self Care | Payer: Self-pay | Source: Home / Self Care | Attending: General Surgery

## 2019-05-06 DIAGNOSIS — Z79899 Other long term (current) drug therapy: Secondary | ICD-10-CM | POA: Diagnosis not present

## 2019-05-06 DIAGNOSIS — I10 Essential (primary) hypertension: Secondary | ICD-10-CM | POA: Insufficient documentation

## 2019-05-06 DIAGNOSIS — Z823 Family history of stroke: Secondary | ICD-10-CM | POA: Diagnosis not present

## 2019-05-06 DIAGNOSIS — D176 Benign lipomatous neoplasm of spermatic cord: Secondary | ICD-10-CM | POA: Diagnosis not present

## 2019-05-06 DIAGNOSIS — K603 Anal fistula: Secondary | ICD-10-CM | POA: Insufficient documentation

## 2019-05-06 DIAGNOSIS — E7800 Pure hypercholesterolemia, unspecified: Secondary | ICD-10-CM | POA: Insufficient documentation

## 2019-05-06 DIAGNOSIS — Z8042 Family history of malignant neoplasm of prostate: Secondary | ICD-10-CM | POA: Insufficient documentation

## 2019-05-06 DIAGNOSIS — K4090 Unilateral inguinal hernia, without obstruction or gangrene, not specified as recurrent: Secondary | ICD-10-CM | POA: Insufficient documentation

## 2019-05-06 DIAGNOSIS — Z881 Allergy status to other antibiotic agents status: Secondary | ICD-10-CM | POA: Diagnosis not present

## 2019-05-06 DIAGNOSIS — K5790 Diverticulosis of intestine, part unspecified, without perforation or abscess without bleeding: Secondary | ICD-10-CM | POA: Diagnosis not present

## 2019-05-06 DIAGNOSIS — Z86010 Personal history of colonic polyps: Secondary | ICD-10-CM | POA: Diagnosis not present

## 2019-05-06 DIAGNOSIS — N433 Hydrocele, unspecified: Secondary | ICD-10-CM | POA: Insufficient documentation

## 2019-05-06 HISTORY — PX: INGUINAL HERNIA REPAIR: SHX194

## 2019-05-06 SURGERY — REPAIR, HERNIA, INGUINAL, ADULT
Anesthesia: General | Laterality: Left

## 2019-05-06 MED ORDER — PHENYLEPHRINE HCL (PRESSORS) 10 MG/ML IV SOLN
INTRAVENOUS | Status: DC | PRN
  Administered 2019-05-06 (×4): 200 ug via INTRAVENOUS

## 2019-05-06 MED ORDER — FAMOTIDINE 20 MG PO TABS
ORAL_TABLET | ORAL | Status: AC
  Administered 2019-05-06: 20 mg via ORAL
  Filled 2019-05-06: qty 1

## 2019-05-06 MED ORDER — BUPIVACAINE LIPOSOME 1.3 % IJ SUSP
INTRAMUSCULAR | Status: AC
  Filled 2019-05-06: qty 20

## 2019-05-06 MED ORDER — BUPIVACAINE-EPINEPHRINE (PF) 0.25% -1:200000 IJ SOLN
INTRAMUSCULAR | Status: AC
  Filled 2019-05-06: qty 30

## 2019-05-06 MED ORDER — FAMOTIDINE 20 MG PO TABS
20.0000 mg | ORAL_TABLET | Freq: Once | ORAL | Status: AC
  Administered 2019-05-06: 08:00:00 20 mg via ORAL

## 2019-05-06 MED ORDER — CEFAZOLIN SODIUM-DEXTROSE 2-4 GM/100ML-% IV SOLN
INTRAVENOUS | Status: AC
  Filled 2019-05-06: qty 100

## 2019-05-06 MED ORDER — BUPIVACAINE-EPINEPHRINE 0.25% -1:200000 IJ SOLN
INTRAMUSCULAR | Status: DC | PRN
  Administered 2019-05-06: 30 mL

## 2019-05-06 MED ORDER — ONDANSETRON HCL 4 MG/2ML IJ SOLN: 4.0000 mg | Freq: Once | INTRAMUSCULAR | Status: DC | PRN

## 2019-05-06 MED ORDER — FENTANYL CITRATE (PF) 100 MCG/2ML IJ SOLN
INTRAMUSCULAR | Status: AC
  Filled 2019-05-06: qty 2

## 2019-05-06 MED ORDER — PROPOFOL 10 MG/ML IV BOLUS
INTRAVENOUS | Status: DC | PRN
  Administered 2019-05-06: 120 mg via INTRAVENOUS

## 2019-05-06 MED ORDER — FENTANYL CITRATE (PF) 100 MCG/2ML IJ SOLN: 25.0000 ug | INTRAMUSCULAR | Status: DC | PRN

## 2019-05-06 MED ORDER — BUPIVACAINE LIPOSOME 1.3 % IJ SUSP
INTRAMUSCULAR | Status: DC | PRN
  Administered 2019-05-06: 20 mL

## 2019-05-06 MED ORDER — LACTATED RINGERS IV SOLN
INTRAVENOUS | Status: DC
  Administered 2019-05-06 (×2): via INTRAVENOUS

## 2019-05-06 MED ORDER — LIDOCAINE HCL (CARDIAC) PF 100 MG/5ML IV SOSY
PREFILLED_SYRINGE | INTRAVENOUS | Status: DC | PRN
  Administered 2019-05-06: 80 mg via INTRAVENOUS

## 2019-05-06 MED ORDER — HYDROCODONE-ACETAMINOPHEN 5-325 MG PO TABS: 1.0000 | ORAL_TABLET | ORAL | 0 refills | Status: AC | PRN

## 2019-05-06 MED ORDER — ONDANSETRON HCL 4 MG/2ML IJ SOLN
INTRAMUSCULAR | Status: DC | PRN
  Administered 2019-05-06: 4 mg via INTRAVENOUS

## 2019-05-06 MED ORDER — SUGAMMADEX SODIUM 200 MG/2ML IV SOLN
INTRAVENOUS | Status: DC | PRN
  Administered 2019-05-06: 200 mg via INTRAVENOUS

## 2019-05-06 MED ORDER — FENTANYL CITRATE (PF) 100 MCG/2ML IJ SOLN
INTRAMUSCULAR | Status: DC | PRN
  Administered 2019-05-06 (×2): 50 ug via INTRAVENOUS

## 2019-05-06 MED ORDER — PROPOFOL 10 MG/ML IV BOLUS
INTRAVENOUS | Status: AC
  Filled 2019-05-06: qty 20

## 2019-05-06 MED ORDER — DEXAMETHASONE SODIUM PHOSPHATE 10 MG/ML IJ SOLN
INTRAMUSCULAR | Status: DC | PRN
  Administered 2019-05-06: 10 mg via INTRAVENOUS

## 2019-05-06 MED ORDER — ROCURONIUM BROMIDE 100 MG/10ML IV SOLN
INTRAVENOUS | Status: DC | PRN
  Administered 2019-05-06: 40 mg via INTRAVENOUS

## 2019-05-06 SURGICAL SUPPLY — 30 items
BLADE SURG 15 STRL LF DISP TIS (BLADE) ×1 IMPLANT
BLADE SURG 15 STRL SS (BLADE) ×2
CANISTER SUCT 1200ML W/VALVE (MISCELLANEOUS) ×3 IMPLANT
CHLORAPREP W/TINT 26 (MISCELLANEOUS) ×3 IMPLANT
COVER WAND RF STERILE (DRAPES) ×3 IMPLANT
DERMABOND ADVANCED (GAUZE/BANDAGES/DRESSINGS) ×2
DERMABOND ADVANCED .7 DNX12 (GAUZE/BANDAGES/DRESSINGS) ×1 IMPLANT
DRAIN PENROSE 1/4X12 LTX (DRAIN) ×3 IMPLANT
DRAPE LAPAROTOMY 100X77 ABD (DRAPES) ×3 IMPLANT
ELECT REM PT RETURN 9FT ADLT (ELECTROSURGICAL) ×3
ELECTRODE REM PT RTRN 9FT ADLT (ELECTROSURGICAL) ×1 IMPLANT
GLOVE BIO SURGEON STRL SZ 6.5 (GLOVE) ×4 IMPLANT
GLOVE BIO SURGEONS STRL SZ 6.5 (GLOVE) ×2
GLOVE INDICATOR 6.5 STRL GRN (GLOVE) ×3 IMPLANT
GOWN STRL REUS W/ TWL LRG LVL3 (GOWN DISPOSABLE) ×2 IMPLANT
GOWN STRL REUS W/TWL LRG LVL3 (GOWN DISPOSABLE) ×4
LABEL OR SOLS (LABEL) ×3 IMPLANT
MESH HERNIA 6X13 (Mesh General) ×3 IMPLANT
NEEDLE HYPO 22GX1.5 SAFETY (NEEDLE) ×3 IMPLANT
NS IRRIG 500ML POUR BTL (IV SOLUTION) ×3 IMPLANT
PACK BASIN MINOR ARMC (MISCELLANEOUS) ×3 IMPLANT
SUT MNCRL 4-0 (SUTURE) ×2
SUT MNCRL 4-0 27XMFL (SUTURE) ×1
SUT SURGILON 0 BLK (SUTURE) ×6 IMPLANT
SUT VIC AB 2-0 BRD 54 (SUTURE) ×3 IMPLANT
SUT VIC AB 2-0 CT2 27 (SUTURE) ×3 IMPLANT
SUT VIC AB 3-0 SH 27 (SUTURE) ×2
SUT VIC AB 3-0 SH 27X BRD (SUTURE) ×1 IMPLANT
SUTURE MNCRL 4-0 27XMF (SUTURE) ×1 IMPLANT
SYR 10ML LL (SYRINGE) ×3 IMPLANT

## 2019-05-06 NOTE — Transfer of Care (Signed)
Immediate Anesthesia Transfer of Care Note  Patient: [name]  Procedure(s) Performed: HERNIA REPAIR INGUINAL ADULT OPEN WITH MESH, LEFT (Left )  Patient Location: PACU  Anesthesia Type:General  Level of Consciousness: awake, alert  and oriented  Airway & Oxygen Therapy: Patient Spontanous Breathing and Patient connected to face mask oxygen  Post-op Assessment: Report given to RN and Post -op Vital signs reviewed and stable  Post vital signs: Reviewed and stable  Last Vitals:  Vitals Value Taken Time  BP 140/86 05/06/19 1032  Temp 36.3 C 05/06/19 1032  Pulse 87 05/06/19 1033  Resp 18 05/06/19 1033  SpO2 100 % 05/06/19 1033  Vitals shown include unvalidated device data.  Last Pain:  Vitals:   05/06/19 0732  TempSrc: Oral  PainSc: 0-No pain         Complications: No apparent anesthesia complications

## 2019-05-06 NOTE — Anesthesia Procedure Notes (Signed)
Procedure Name: Intubation Date/Time: 05/06/2019 8:34 AM Performed by: Philbert Riser, CRNA Pre-anesthesia Checklist: Patient identified, Emergency Drugs available, Suction available, Patient being monitored and Timeout performed Patient Re-evaluated:Patient Re-evaluated prior to induction Oxygen Delivery Method: Circle system utilized and Simple face mask Preoxygenation: Pre-oxygenation with 100% oxygen Induction Type: IV induction Ventilation: Mask ventilation without difficulty Laryngoscope Size: Mac and 3 Grade View: Grade I Tube type: Oral Tube size: 7.5 mm Number of attempts: 1 Airway Equipment and Method: Stylet Placement Confirmation: ETT inserted through vocal cords under direct vision,  positive ETCO2 and breath sounds checked- equal and bilateral Secured at: 21 cm Tube secured with: Tape Dental Injury: Teeth and Oropharynx as per pre-operative assessment

## 2019-05-06 NOTE — Anesthesia Preprocedure Evaluation (Signed)
Anesthesia Evaluation  Patient identified by MRN, date of birth, ID band Patient awake    Reviewed: Allergy & Precautions, NPO status , Patient's Chart, lab work & pertinent test results, reviewed documented beta blocker date and time   Airway Mallampati: II  TM Distance: >3 FB     Dental  (+) Chipped, Upper Dentures, Partial Lower   Pulmonary           Cardiovascular hypertension, Pt. on medications      Neuro/Psych    GI/Hepatic   Endo/Other    Renal/GU Renal disease     Musculoskeletal   Abdominal   Peds  Hematology   Anesthesia Other Findings Hypertensive. EKG shows poor R waves, otherwise ok.   Reproductive/Obstetrics                             Anesthesia Physical Anesthesia Plan  ASA: III  Anesthesia Plan: General   Post-op Pain Management:    Induction: Intravenous  PONV Risk Score and Plan:   Airway Management Planned: Oral ETT  Additional Equipment:   Intra-op Plan:   Post-operative Plan:   Informed Consent: I have reviewed the patients History and Physical, chart, labs and discussed the procedure including the risks, benefits and alternatives for the proposed anesthesia with the patient or authorized representative who has indicated his/her understanding and acceptance.       Plan Discussed with: CRNA  Anesthesia Plan Comments:         Anesthesia Quick Evaluation

## 2019-05-06 NOTE — Op Note (Signed)
Preoperative diagnosis: Left Inguinal Hernia.  Postoperative diagnosis: Left Indirect Inguinal Hernia.  Procedure: Left Inguinal hernia repair with mesh  Anesthesia: General  Surgeon: Dr. Windell Moment  Wound Classification: Clean  Indications:  Patient is a 78 y.o. male developed a symptomatic left inguinal hernia. Repair was indicated to avoid complications of incarceration, obstruction and pain, and a prosthetic mesh repair was elected.  Findings: 1. Vas Deferens and cord structures identified and preserved 2. An indirect inguinal hernia was identified 3. Pre Shaped mesh used for repair 4. Adequate hemostasis achieved  Description of procedure: The patient was taken to the operating room. A time-out was completed verifying correct patient, procedure, site, positioning, and implant(s) and/or special equipment prior to beginning this procedure. spinal anesthesia was induced. The left groin was prepped and draped in the usual sterile fashion. An incision was marked in a natural skin crease and planned to end near the pubic tubercle.  The skin crease incision was made with a knife and deepened through Scarpa's and Camper's fascia with electrocautery until the aponeurosis of the external oblique was encountered. This was cleaned and the external ring was exposed. Hemostasis was achieved in the wound. An incision was made in the midportion of the external oblique aponeurosis in the direction of its fibers. The ilioinguinal nerve was identified and protected throughout the dissection. Flaps of the external oblique were developed cephalad and inferiorly.  The cord was identified. It was gently dissected free at the pubic tubercle and encircled with a Penrose drain. Attention was directed to the anteromedial aspect of the cord, where an indirect hernia sac was identified. The sac was carefully dissected free of the cord down to the level of the internal ring. The vas and testicular vessels were  identified and protected from harm. The sac was opened and contents were reduced. A finger was passed into the peritoneal cavity and the floor of the inguinal canal assessed and found to be strong. The femoral canal was palpated and no hernia identified. The sac was twisted and suture ligated with 2-0 vicryl. Redundant sac was excised and submitted to pathology. The stump of the sac was checked for hemostasis and allowed to retract into the abdomen.  Attention then turned to the floor of the canal, which appeared to be grossly weakened without a well-defined defect or sac. The Pre Shaped Mesh System mesh was inserted. Beginning at the pubic tubercle, the mesh was sutured to the inguinal ligament inferiorly and the conjoint tendon superiorly using interrupted 0 nonabsorbable sutures. Care was taken to assure that the mesh was placed in a relaxed fashion to avoid excessive tension and that no neurovascular structures were caught in the repair. Laterally, the tails of the mesh were crossed and the internal ring recreated.  Hemostasis was again checked. The Penrose drain was removed. The external oblique aponeurosis was closed with a running suture of 3-0 Vicryl, taking care not to catch the ilioinguinal nerve in the suture line. Scarpa's fascia was closed with interrupted 3-0 Vicryl.  The skin was closed with a subcuticular stitch of Monocryl 4-0. Dermabond was applied.  The testis was gently pulled down into its anatomic position in the scrotum.  The patient tolerated the procedure well and was taken to the postanesthesia care unit in stable condition.   Specimen: Hernia sac and cord lipoma  Complications: None  Estimated Blood Loss: 20 mL

## 2019-05-06 NOTE — Anesthesia Post-op Follow-up Note (Signed)
Anesthesia QCDR form completed.        

## 2019-05-06 NOTE — Discharge Instructions (Signed)
  Diet: Resume home heart healthy regular diet.   Activity: No heavy lifting >20 pounds (children, pets, laundry, garbage) or strenuous activity until follow-up, but light activity and walking are encouraged. Do not drive or drink alcohol if taking narcotic pain medications.  Wound care: May shower with soapy water and pat dry (do not rub incisions), but no baths or submerging incision underwater until follow-up. (no swimming)   Medications: Resume all home medications. For mild to moderate pain: acetaminophen (Tylenol) or ibuprofen (if no kidney disease). Combining Tylenol with alcohol can substantially increase your risk of causing liver disease. Narcotic pain medications, if prescribed, can be used for severe pain, though may cause nausea, constipation, and drowsiness. Do not combine Tylenol and Norco within a 6 hour period as Norco contains Tylenol. If you do not need the narcotic pain medication, you do not need to fill the prescription.  Call office ([phone]) at any time if any questions, worsening pain, fevers/chills, bleeding, drainage from incision site, or other concerns.   AMBULATORY SURGERY  DISCHARGE INSTRUCTIONS   1) The drugs that you were given will stay in your system until tomorrow so for the next 24 hours you should not:  A) Drive an automobile B) Make any legal decisions C) Drink any alcoholic beverage   2) You may resume regular meals tomorrow.  Today it is better to start with liquids and gradually work up to solid foods.  You may eat anything you prefer, but it is better to start with liquids, then soup and crackers, and gradually work up to solid foods.   3) Please notify your doctor immediately if you have any unusual bleeding, trouble breathing, redness and pain at the surgery site, drainage, fever, or pain not relieved by medication.    4) Additional Instructions:        Please contact your physician with any problems or Same Day Surgery at  [phone], Monday through Friday 6 am to 4 pm, or [place] [facility] [place] [facility] number at [phone]. 

## 2019-05-06 NOTE — Interval H&P Note (Signed)
History and Physical Interval Note:  05/06/2019 7:53 AM  [name]  has presented today for surgery, with the diagnosis of K40.90 OPEN LEFT INGUINAL HERNIA.  The various methods of treatment have been discussed with the patient and family. After consideration of risks, benefits and other options for treatment, the patient has consented to  Procedure(s): HERNIA REPAIR INGUINAL ADULT OPEN WITH MESH, LEFT (Left) as a surgical intervention.  The patient's history has been reviewed, patient examined, no change in status, stable for surgery.  I have reviewed the patient's chart and labs.  Left inguinal area marked in the pre procedure room. Questions were answered to the patient's satisfaction.     Herbert Pun

## 2019-05-06 NOTE — Anesthesia Postprocedure Evaluation (Signed)
Anesthesia Post Note  Patient: [name]  Procedure(s) Performed: HERNIA REPAIR INGUINAL ADULT OPEN WITH MESH, LEFT (Left )  Patient location during evaluation: PACU Anesthesia Type: General Level of consciousness: awake and alert Pain management: pain level controlled Vital Signs Assessment: post-procedure vital signs reviewed and stable Respiratory status: spontaneous breathing, nonlabored ventilation, respiratory function stable and patient connected to nasal cannula oxygen Cardiovascular status: blood pressure returned to baseline and stable Postop Assessment: no apparent nausea or vomiting Anesthetic complications: no     Last Vitals:  Vitals:   05/06/19 1130 05/06/19 1207  BP: (!) 147/86 (!) 151/77  Pulse: 92 96  Resp: 16 18  Temp: 36.8 C (!) 36.3 C  SpO2: 97% 98%    Last Pain:  Vitals:   05/06/19 1207  TempSrc:   PainSc: 0-No pain                 Reverie Vaquera S

## 2019-05-07 ENCOUNTER — Encounter: Payer: Self-pay | Admitting: General Surgery

## 2019-05-07 LAB — SURGICAL PATHOLOGY

## 2019-05-29 ENCOUNTER — Encounter: Admission: RE | Payer: Self-pay | Source: Home / Self Care

## 2019-05-29 ENCOUNTER — Ambulatory Visit: Admission: RE | Admit: 2019-05-29 | Payer: Medicare Other | Source: Home / Self Care | Admitting: General Surgery

## 2019-05-29 SURGERY — REPAIR, HERNIA, INGUINAL, ADULT
Anesthesia: General | Laterality: Left

## 2019-08-11 ENCOUNTER — Other Ambulatory Visit: Payer: Self-pay

## 2019-08-11 ENCOUNTER — Encounter: Payer: Self-pay | Admitting: Urology

## 2019-08-11 ENCOUNTER — Ambulatory Visit (INDEPENDENT_AMBULATORY_CARE_PROVIDER_SITE_OTHER): Payer: Medicare Other | Admitting: Urology

## 2019-08-11 VITALS — BP 187/91 | HR 85 | Ht 68.0 in | Wt 175.0 lb

## 2019-08-11 DIAGNOSIS — R972 Elevated prostate specific antigen [PSA]: Secondary | ICD-10-CM | POA: Diagnosis not present

## 2019-08-11 DIAGNOSIS — R350 Frequency of micturition: Secondary | ICD-10-CM

## 2019-08-11 DIAGNOSIS — R310 Gross hematuria: Secondary | ICD-10-CM | POA: Diagnosis not present

## 2019-08-11 DIAGNOSIS — N210 Calculus in bladder: Secondary | ICD-10-CM

## 2019-08-11 NOTE — Progress Notes (Signed)
08/11/2019 11:29 AM   [name] 03/25/1941 [id number]  Referring provider: Marguerita Merles, [place] [address] [place],  [place] [address]  Chief Complaint  Patient presents with  . Elevated PSA    HPI: 79 yo M with history of elevated PSA who returns for routine follow-up.  He did not have his PSA drawn prior to today's appointment.  Notably, the patient has a personal history of elevated and rising PSA.  As part of this evaluation he underwent prostate MRI last year on 9/19 which was unremarkable.  He has a family history of PCa, with his father who was diagnosed with prostate cancer.  He has minimal urinary symptoms which are stable.. No weight loss or bone pain.  He does have evidence of chronic outlet obstruction with a thickened bladder wall diffusely incidentally on MRI.  He also has known very small bladder stones on CT scan last year. He continues on dutasteride.  He reports today that about 2 weeks ago, he passed 2 small stones.  This was associated with an episode of gross hematuria.  He also had some urinary urgency frequency which is improving.  His gross hematuria has completely resolved.  He was worried about kidney stones and is unaware of the punctate stones in his bladder.  He denies any ongoing symptoms today.  PMH: Past Medical History:  Diagnosis Date  . Acute appendicitis    perforated  . Anal fistula   . Chronic kidney disease   . Diverticulitis   . Hemorrhoids, internal   . History of kidney stones   . Hypertension   . Inguinal hernia     Surgical History: Past Surgical History:  Procedure Laterality Date  . APPENDECTOMY    . colonoscopy 2010    . HERNIA REPAIR  02/04/2012   Dr. Burt Knack  . INGUINAL HERNIA REPAIR  2013   Dr. Burt Knack  . INGUINAL HERNIA REPAIR Left 05/06/2019   Procedure: HERNIA REPAIR INGUINAL ADULT OPEN WITH MESH, LEFT;  Surgeon: Herbert Pun, MD;  Location: [facility] ORS;  Service: General;  Laterality: Left;  .  LAPAROSCOPIC APPENDECTOMY  07/03/2011   Dr Burt Knack  . RECTAL SURGERY  08/08/2009   Dr. Pat Patrick    Home Medications:  Allergies as of 08/11/2019      Reactions   Clindamycin/lincomycin Rash   Tachycardia      Medication List       Accurate as of August 11, 2019 11:59 PM. If you have any questions, ask your nurse or doctor.        STOP taking these medications   ciprofloxacin 500 MG tablet Commonly known as: CIPRO Stopped by: Hollice Espy, MD   metroNIDAZOLE 500 MG tablet Commonly known as: FLAGYL Stopped by: Hollice Espy, MD     TAKE these medications   dutasteride 0.5 MG capsule Commonly known as: AVODART TAKE 1 CAPSULE BY MOUTH AT BEDTIME   hydrochlorothiazide 25 MG tablet Commonly known as: HYDRODIURIL Take 25 mg by mouth at bedtime.   lovastatin 40 MG tablet Commonly known as: MEVACOR Take 80 mg by mouth at bedtime.       Allergies:  Allergies  Allergen Reactions  . Clindamycin/Lincomycin Rash    Tachycardia     Family History: Family History  Problem Relation Age of Onset  . Hypertension Mother   . Cancer Father        prostate  . Hypertension Father   . Kidney disease Neg Hx   . Kidney cancer Neg  Hx   . Bladder Cancer Neg Hx     Social History:  reports that he has never smoked. He has never used smokeless tobacco. He reports current alcohol use. He reports that he does not use drugs.  ROS: UROLOGY Frequent Urination?: Yes Hard to postpone urination?: Yes Burning/pain with urination?: No Get up at night to urinate?: No Leakage of urine?: No Urine stream starts and stops?: No Trouble starting stream?: No Do you have to strain to urinate?: No Blood in urine?: Yes Urinary tract infection?: No Sexually transmitted disease?: No Injury to kidneys or bladder?: No Painful intercourse?: No Weak stream?: No Erection problems?: No Penile pain?: No  Gastrointestinal Nausea?: No Vomiting?: No Indigestion/heartburn?: No Diarrhea?: No  Constipation?: No  Constitutional Fever: No Night sweats?: No Weight loss?: No Fatigue?: No  Skin Skin rash/lesions?: No Itching?: No  Eyes Blurred vision?: No Double vision?: No  Ears/Nose/Throat Sore throat?: No Sinus problems?: No  Hematologic/Lymphatic Swollen glands?: No Easy bruising?: No  Cardiovascular Leg swelling?: No Chest pain?: No  Respiratory Cough?: No Shortness of breath?: No  Endocrine Excessive thirst?: No  Musculoskeletal Back pain?: No Joint pain?: No  Neurological Headaches?: No Dizziness?: No  Psychologic Depression?: No Anxiety?: No  Physical Exam: BP (!) 187/91   Pulse 85   Ht 5\' 8"  (1.727 m)   Wt 175 lb (79.4 kg)   BMI 26.61 kg/m   Constitutional:  Alert and oriented, No acute distress. HEENT: [place] AT, moist mucus membranes.  Trachea midline, no masses. Cardiovascular: No clubbing, cyanosis, or edema. Respiratory: Normal respiratory effort, no increased work of breathing. GI: Abdomen is soft, nontender, nondistended, no abdominal masses GU: No CVA tenderness Rectal: Enlarged prostate, nontender, no nodules. Skin: No rashes, bruises or suspicious lesions. Neurologic: Grossly intact, no focal deficits, moving all 4 extremities. Psychiatric: Normal mood and affect.  Laboratory Data: Lab Results  Component Value Date   WBC 11.7 (H) 12/27/2018   HGB 15.2 12/27/2018   HCT 43.6 12/27/2018   MCV 82.0 12/27/2018   PLT 338 12/27/2018    Lab Results  Component Value Date   CREATININE 1.67 (H) 12/27/2018    Lab Results  Component Value Date   PSA 8.4 04/14/2018    Urinalysis Component     Latest Ref Rng & Units 08/11/2019  WBC, UA     0 - 5 /hpf 0-5  RBC     0 - 2 /hpf None seen  Epithelial Cells (non renal)     0 - 10 /hpf 0-10  Renal Epithel, UA     None seen /hpf 0-10 (A)  Bacteria, UA     None seen/Few None seen   Component     Latest Ref Rng & Units 08/11/2019  Specific Gravity, UA     1.005 - 1.030  1.025  pH, UA     5.0 - 7.5 5.0  Color, UA     Yellow Yellow  Appearance Ur     Clear Hazy (A)  Leukocytes,UA     Negative Negative  Protein,UA     Negative/Trace 1+ (A)  Glucose, UA     Negative Negative  Ketones, UA     Negative Negative  RBC, UA     Negative Trace (A)  Bilirubin, UA     Negative Negative  Urobilinogen, Ur     0.2 - 1.0 mg/dL 0.2  Nitrite, UA     Negative Negative  Microscopic Examination      See below:  Assessment & Plan:    1. Elevated PSA Personal history of elevated PSA Last year, prostate MRI was reassuring PSA was drawn today and is pending, follow-up plan TBD based on the results - PSA  2. Urinary frequency Improved after passing small bladder stone and resolution of hematuria Continue dutasteride No evidence of UTI as contributing factor  3. Gross hematuria Likely secondary to passing small bladder stone - Urinalysis, Complete - CULTURE, URINE COMPREHENSIVE  4. 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  Hollice Espy, MD  [facility] [address], [place], [place] [address] [phone]

## 2019-08-12 ENCOUNTER — Telehealth: Payer: Self-pay | Admitting: *Deleted

## 2019-08-12 DIAGNOSIS — R972 Elevated prostate specific antigen [PSA]: Secondary | ICD-10-CM

## 2019-08-12 LAB — MICROSCOPIC EXAMINATION
Bacteria, UA: NONE SEEN
RBC, Urine: NONE SEEN /hpf (ref 0–2)

## 2019-08-12 LAB — URINALYSIS, COMPLETE
Bilirubin, UA: NEGATIVE
Glucose, UA: NEGATIVE
Ketones, UA: NEGATIVE
Leukocytes,UA: NEGATIVE
Nitrite, UA: NEGATIVE
Specific Gravity, UA: 1.025 (ref 1.005–1.030)
Urobilinogen, Ur: 0.2 mg/dL (ref 0.2–1.0)
pH, UA: 5 (ref 5.0–7.5)

## 2019-08-12 LAB — PSA: Prostate Specific Ag, Serum: 11.6 ng/mL — ABNORMAL HIGH (ref 0.0–4.0)

## 2019-08-12 NOTE — Telephone Encounter (Addendum)
Patient informed-lab appointment scheduled. Verbalized understanding.    ----- Message from Hollice Espy, MD sent at 08/12/2019 11:31 AM EDT ----- Please let Mr. [name] know that his PSA is up again, significantly from last year to 11.6 previously 7.3.  He did have this episode of passing some small bladder stones recently, gross hematuria and irritative urinary symptoms which may have caused his PSA to go up secondary to inflammation.  I like him to repeat his PSA in about 6 weeks.  If it continues to be elevated, we may have to either consider prostate biopsy or repeat prostate MRI.  Close follow-up is strongly recommended.  Please order PSA and arrange for lab visit in 6 weeks.  Hollice Espy, MD

## 2019-08-15 LAB — CULTURE, URINE COMPREHENSIVE

## 2019-09-23 ENCOUNTER — Other Ambulatory Visit: Payer: Self-pay

## 2019-09-29 ENCOUNTER — Other Ambulatory Visit: Payer: Self-pay | Admitting: Student

## 2019-09-29 ENCOUNTER — Ambulatory Visit
Admission: RE | Admit: 2019-09-29 | Discharge: 2019-09-29 | Disposition: A | Discharge status: Home / Self Care | Payer: Medicare Other | Source: Ambulatory Visit | Attending: Student | Admitting: Student

## 2019-09-29 ENCOUNTER — Other Ambulatory Visit: Payer: Self-pay

## 2019-09-29 DIAGNOSIS — R1909 Other intra-abdominal and pelvic swelling, mass and lump: Secondary | ICD-10-CM | POA: Diagnosis present

## 2019-10-16 ENCOUNTER — Other Ambulatory Visit: Payer: Self-pay

## 2019-10-16 DIAGNOSIS — R350 Frequency of micturition: Secondary | ICD-10-CM

## 2019-10-18 NOTE — Progress Notes (Signed)
10/19/2019 9:38 PM   Makari Roper May 27, 1941 [id number]  Referring provider: Marguerita Merles, [place],  [place] [address]  Chief Complaint  Patient presents with  . Benign Prostatic Hypertrophy    HPI: Mr. Rabbitt is a 78 year old male with elevated PSA, BPH with LU TS and bladder stones who presents today for difficulty urinating.  Elevated PSA Prostate MRI 07/2018 - No radiographic evidence of high-grade prostate carcinoma. PI-RADS.  Very Low (clinically significant cancer is highly unlikely to be present).  Negative biopsies in the past.  PSA History:  6.8 ng/mL on 03/02/2013-patient started on dutasteride  3.5 ng/mL on 07/20/2013 (7.0)  4.3 ng/mL on 06/07/2014 (8.6)    4.4 ng/mL on 12/27/2014 (8.8)    4.1 ng/mL on 06/30/2015 (8.2)    6.0 ng/mL on 03/13/2017 (12.0)    7.3 ng/mL on 11/27/2017 (14.6)    8.4 ng/mL on 04/14/2018 (16.8)  Component     Latest Ref Rng & Units 11/27/2017 08/11/2019  Prostate Specific Ag, Serum     0.0 - 4.0 ng/mL 7.3 (H) 11.6 (H)   BPH WITH LUTS  (prostate and/or bladder) IPSS score: 11/2       PVR: 21 mL    Previous score: 4/2    Previous PVR: 12 mL   Major complaint(s):  Hard to postpone urination for the last few weeks.  He feels that he is passing more stones and is also wondering if the cyst he has in his inguinal area is causing the urgency.  Denies any dysuria, hematuria or suprapubic pain.   UA benign.  He was seen approximately a month ago for similar complaints by his PCP, urine was clear at that visit and urine culture was negative.  The cyst in the inguinal area was ultrasounded and noted only to be a cyst.  Currently taking: Avodart 0.5 mg daily   4.8 x 3.8 x 5.2 cm (volume = 50 cm^3)   Denies any recent fevers, chills, nausea or vomiting.  IPSS    Row Name 10/19/19 1100         International Prostate Symptom Score   How often have you had the sensation of not emptying your bladder?  Less than  half the time     How often have you had to urinate less than every two hours?  Less than half the time     How often have you found you stopped and started again several times when you urinated?  Less than 1 in 5 times     How often have you found it difficult to postpone urination?  Less than half the time     How often have you had a weak urinary stream?  Less than 1 in 5 times     How often have you had to strain to start urination?  Not at All     How many times did you typically get up at night to urinate?  3 Times     Total IPSS Score  11       Quality of Life due to urinary symptoms   If you were to spend the rest of your life with your urinary condition just the way it is now how would you feel about that?  Mostly Satisfied        Score:  1-7 Mild 8-19 Moderate 20-35 Severe  Bladder stones Multiple small layering bladder calculi on contrast study in 12/2018.  Not seen on  today's KUB.  PMH: Past Medical History:  Diagnosis Date  . Acute appendicitis    perforated  . Anal fistula   . Chronic kidney disease   . Diverticulitis   . Hemorrhoids, internal   . History of kidney stones   . Hypertension   . Inguinal hernia     Surgical History: Past Surgical History:  Procedure Laterality Date  . APPENDECTOMY    . colonoscopy 2010    . HERNIA REPAIR  02/04/2012   Dr. Burt Knack  . INGUINAL HERNIA REPAIR  2013   Dr. Burt Knack  . INGUINAL HERNIA REPAIR Left 05/06/2019   Procedure: HERNIA REPAIR INGUINAL ADULT OPEN WITH MESH, LEFT;  Surgeon: Herbert Pun, MD;  Location: [facility] ORS;  Service: General;  Laterality: Left;  . LAPAROSCOPIC APPENDECTOMY  07/03/2011   Dr Burt Knack  . RECTAL SURGERY  08/08/2009   Dr. Pat Patrick    Home Medications:  Allergies as of 10/19/2019      Reactions   Clindamycin/lincomycin Rash   Tachycardia      Medication List       Accurate as of October 19, 2019 11:59 PM. If you have any questions, ask your nurse or doctor.        dutasteride  0.5 MG capsule Commonly known as: AVODART TAKE 1 CAPSULE BY MOUTH AT BEDTIME   hydrochlorothiazide 25 MG tablet Commonly known as: HYDRODIURIL Take 25 mg by mouth at bedtime.   lovastatin 40 MG tablet Commonly known as: MEVACOR Take 80 mg by mouth at bedtime.   tamsulosin 0.4 MG Caps capsule Commonly known as: FLOMAX Take 1 capsule (0.4 mg total) by mouth daily. Started by: Zara Council, PA-C       Allergies:  Allergies  Allergen Reactions  . Clindamycin/Lincomycin Rash    Tachycardia     Family History: Family History  Problem Relation Age of Onset  . Hypertension Mother   . Cancer Father        prostate  . Hypertension Father   . Kidney disease Neg Hx   . Kidney cancer Neg Hx   . Bladder Cancer Neg Hx     Social History:  reports that he has never smoked. He has never used smokeless tobacco. He reports current alcohol use. He reports that he does not use drugs.  ROS: UROLOGY Frequent Urination?: No Hard to postpone urination?: Yes Burning/pain with urination?: No Get up at night to urinate?: No Leakage of urine?: No Urine stream starts and stops?: No Trouble starting stream?: No Do you have to strain to urinate?: No Blood in urine?: No Urinary tract infection?: No Sexually transmitted disease?: No Injury to kidneys or bladder?: No Painful intercourse?: No Weak stream?: No Erection problems?: No Penile pain?: No  Gastrointestinal Nausea?: No Vomiting?: No Indigestion/heartburn?: No Diarrhea?: No Constipation?: No  Constitutional Fever: No Night sweats?: No Weight loss?: No Fatigue?: No  Skin Skin rash/lesions?: No Itching?: No  Eyes Blurred vision?: No Double vision?: No  Ears/Nose/Throat Sore throat?: No Sinus problems?: No  Hematologic/Lymphatic Swollen glands?: No Easy bruising?: No  Cardiovascular Leg swelling?: No Chest pain?: No  Respiratory Cough?: No Shortness of breath?: No  Endocrine Excessive thirst?:  No  Musculoskeletal Back pain?: No Joint pain?: No  Neurological Headaches?: No Dizziness?: No  Psychologic Depression?: No Anxiety?: No  Physical Exam: BP (!) 159/72   Pulse 80   Ht 5\' 8"  (1.727 m)   Wt 174 lb (78.9 kg)   BMI 26.46 kg/m   Constitutional:  Well  nourished. Alert and oriented, No acute distress. HEENT: [place] AT, moist mucus membranes.  Trachea midline, no masses. Cardiovascular: No clubbing, cyanosis, or edema. Respiratory: Normal respiratory effort, no increased work of breathing. GI: Abdomen is soft, non tender, non distended, no abdominal masses. Liver and spleen not palpable.  No hernias appreciated.  Stool sample for occult testing is not indicated.  The cyst is appreciated on exam.  It is non-tender.   GU: No CVA tenderness.  No bladder fullness or masses.   Neurologic: Grossly intact, no focal deficits, moving all 4 extremities. Psychiatric: Normal mood and affect.  Laboratory Data: Lab Results  Component Value Date   WBC 11.7 (H) 12/27/2018   HGB 15.2 12/27/2018   HCT 43.6 12/27/2018   MCV 82.0 12/27/2018   PLT 338 12/27/2018    Lab Results  Component Value Date   CREATININE 1.67 (H) 12/27/2018    Lab Results  Component Value Date   PSA 8.4 04/14/2018    No results found for: TESTOSTERONE  No results found for: HGBA1C  No results found for: TSH  No results found for: CHOL, HDL, CHOLHDL, VLDL, LDLCALC  Lab Results  Component Value Date   AST 20 12/27/2018   Lab Results  Component Value Date   ALT 13 12/27/2018   No components found for: ALKALINEPHOPHATASE No components found for: BILIRUBINTOTAL  No results found for: ESTRADIOL  Urinalysis Component     Latest Ref Rng & Units 10/19/2019  Color, Urine     YELLOW YELLOW  Appearance     CLEAR CLEAR  Specific Gravity, Urine     1.005 - 1.030 1.020  pH     5.0 - 8.0 5.0  Glucose, UA     NEGATIVE mg/dL 100 (A)  Hgb urine dipstick     NEGATIVE NEGATIVE  Bilirubin Urine      NEGATIVE NEGATIVE  Ketones, ur     NEGATIVE mg/dL NEGATIVE  Protein     NEGATIVE mg/dL TRACE (A)  Nitrite     NEGATIVE NEGATIVE  Leukocytes,Ua     NEGATIVE NEGATIVE  Squamous Epithelial / LPF     0 - 5 0-5  Non Squamous Epithelial     NONE SEEN PRESENT (A)  WBC, UA     0 - 5 WBC/hpf 0-5  RBC / HPF     0 - 5 RBC/hpf NONE SEEN  Bacteria, UA     NONE SEEN FEW (A)  Mucus      PRESENT   I have reviewed the labs.   Pertinent Imaging: Results for KARIEM, BRISBANE (MRN [id number]) as of 10/19/2019 13:01  Ref. Range 10/19/2019 11:11  Scan Result Unknown 21   CLINICAL DATA:  Bladder stones. Gross hematuria.  EXAM: ABDOMEN - 1 VIEW  COMPARISON:  12/28/2018  FINDINGS: Supine abdomen shows diffuse gaseous distention of small bowel with air in stool scattered along the length of the colon. Gas pattern is not overtly obstructive. Multiple phleboliths are seen over the inferior pelvis. Mesh anchors in the right lower quadrant suggest prior herniorrhaphy. Visualized bony anatomy unremarkable.  IMPRESSION: No substantial change in diffuse gaseous small bowel distention with air in stool in the colon. Diffuse gaseous distention of small bowel with air in stool scattered along the length of the colon. Gas pattern is not overtly obstructive although a component of mild ileus cannot be excluded.   Electronically Signed   By: Misty Stanley M.D.   On: 10/19/2019 17:02 I have independently reviewed the films  and do not appreciate any urinary stones.    Assessment & Plan:    1. Elevated PSA Repeated PSA pending - if returns elevated will need to obtain a repeat prostate MRI or prostate biopsy   2. BPH with LUTS IPSS score is 11/2, it is stable worsening Continue conservative management, avoiding bladder irritants and timed voiding's Most bothersome symptoms is/are urgency Initiate alpha-blocker (tamsulosin 0.4 mg daily) Continue dutasteride 0.5 mg daily; refills  given RTC pending PSA results   3. Bladder stones Likely radiolucent as they did not present on KUB Patient is still not interested in a surgery intervention for his prostate/bladder stones  Return for pending urine culture results .  These notes generated with voice recognition software. I apologize for typographical errors.  Zara Council, PA-C  [facility] [address]  [place], [place] [address] [phone]

## 2019-10-19 ENCOUNTER — Other Ambulatory Visit: Payer: Self-pay

## 2019-10-19 ENCOUNTER — Ambulatory Visit
Admission: RE | Admit: 2019-10-19 | Discharge: 2019-10-19 | Disposition: A | Discharge status: Home / Self Care | Payer: Medicare Other | Attending: Urology | Admitting: Urology

## 2019-10-19 ENCOUNTER — Other Ambulatory Visit
Admission: RE | Admit: 2019-10-19 | Discharge: 2019-10-19 | Disposition: A | Discharge status: Home / Self Care | Payer: Medicare Other | Source: Home / Self Care | Attending: Urology | Admitting: Urology

## 2019-10-19 ENCOUNTER — Encounter: Payer: Self-pay | Admitting: Urology

## 2019-10-19 ENCOUNTER — Ambulatory Visit
Admission: RE | Admit: 2019-10-19 | Discharge: 2019-10-19 | Disposition: A | Discharge status: Home / Self Care | Payer: Medicare Other | Source: Ambulatory Visit | Attending: Urology | Admitting: Urology

## 2019-10-19 ENCOUNTER — Ambulatory Visit (INDEPENDENT_AMBULATORY_CARE_PROVIDER_SITE_OTHER): Payer: Medicare Other | Admitting: Urology

## 2019-10-19 VITALS — BP 159/72 | HR 80 | Ht 68.0 in | Wt 174.0 lb

## 2019-10-19 DIAGNOSIS — R972 Elevated prostate specific antigen [PSA]: Secondary | ICD-10-CM | POA: Insufficient documentation

## 2019-10-19 DIAGNOSIS — N138 Other obstructive and reflux uropathy: Secondary | ICD-10-CM

## 2019-10-19 DIAGNOSIS — N210 Calculus in bladder: Secondary | ICD-10-CM

## 2019-10-19 DIAGNOSIS — R310 Gross hematuria: Secondary | ICD-10-CM

## 2019-10-19 DIAGNOSIS — R350 Frequency of micturition: Secondary | ICD-10-CM

## 2019-10-19 DIAGNOSIS — N401 Enlarged prostate with lower urinary tract symptoms: Secondary | ICD-10-CM

## 2019-10-19 LAB — URINALYSIS, COMPLETE (UACMP) WITH MICROSCOPIC
Bilirubin Urine: NEGATIVE
Glucose, UA: 100 mg/dL — AB
Hgb urine dipstick: NEGATIVE
Ketones, ur: NEGATIVE mg/dL
Leukocytes,Ua: NEGATIVE
Nitrite: NEGATIVE
RBC / HPF: NONE SEEN RBC/hpf (ref 0–5)
Specific Gravity, Urine: 1.02 (ref 1.005–1.030)
pH: 5 (ref 5.0–8.0)

## 2019-10-19 LAB — PSA: Prostatic Specific Antigen: 12.08 ng/mL — ABNORMAL HIGH (ref 0.00–4.00)

## 2019-10-19 LAB — BLADDER SCAN AMB NON-IMAGING: Scan Result: 21

## 2019-10-19 MED ORDER — TAMSULOSIN HCL 0.4 MG PO CAPS: 0.4000 mg | ORAL_CAPSULE | Freq: Every day | ORAL | 3 refills | Status: DC

## 2019-10-20 LAB — URINE CULTURE: Culture: <10000 — AB

## 2019-10-21 ENCOUNTER — Telehealth: Payer: Self-pay | Admitting: *Deleted

## 2019-10-21 NOTE — Telephone Encounter (Addendum)
Patient informed, verbalized understanding. Appointment scheduled.   ----- Message from Hollice Espy, MD sent at 10/20/2019  7:26 PM EST ----- PSA remains elevated and has not come back down.  I would like to see him after the new year to discuss options which may include repeat biopsy versus repeat MRI.  Please schedule.  Hollice Espy, MD

## 2019-10-26 ENCOUNTER — Other Ambulatory Visit: Payer: Self-pay | Admitting: Urology

## 2019-11-25 ENCOUNTER — Encounter: Payer: Self-pay | Admitting: Urology

## 2019-11-25 ENCOUNTER — Other Ambulatory Visit: Payer: Self-pay

## 2019-11-25 ENCOUNTER — Ambulatory Visit (INDEPENDENT_AMBULATORY_CARE_PROVIDER_SITE_OTHER): Payer: Medicare Other | Admitting: Urology

## 2019-11-25 VITALS — BP 156/77 | HR 96 | Ht 68.0 in | Wt 186.0 lb

## 2019-11-25 DIAGNOSIS — N210 Calculus in bladder: Secondary | ICD-10-CM | POA: Diagnosis not present

## 2019-11-25 DIAGNOSIS — N138 Other obstructive and reflux uropathy: Secondary | ICD-10-CM | POA: Diagnosis not present

## 2019-11-25 DIAGNOSIS — R972 Elevated prostate specific antigen [PSA]: Secondary | ICD-10-CM

## 2019-11-25 DIAGNOSIS — N401 Enlarged prostate with lower urinary tract symptoms: Secondary | ICD-10-CM | POA: Diagnosis not present

## 2019-11-25 NOTE — Progress Notes (Signed)
11/25/2019 10:15 AM   [name] [birth year] [id number]  Referring provider: Marguerita Merles, [place],  [place] [address]  Chief Complaint  Patient presents with  . Follow-up    HPI: 79 year old male with a personal history of elevated/rising PSA BPH and bladder stones returns today to discuss his continued PSA rise.  He status post 4 previous prostate biopsies all of which have been negative.  He underwent a prostate MRI in 2019 which was also fairly unremarkable.  Despite this, his PSA continues to rise steadily as outlined below.  He is managed on maximal medical therapy in the form of dutasteride and Flomax.  His clinical symptoms are reasonably well controlled although he did continues to make bladder stones.  In the past, he has declined surgical intervention but may be more open to this in the future.  He is very hesitant to want to pursue repeat prostate biopsy.  He denies any weight loss or bone pain.   PSA History:  6.8 ng/mL on 03/02/2013-patient started on dutasteride  3.5 ng/mL on 07/20/2013 (7.0)  4.3 ng/mL on 06/07/2014 (8.6) 4.4 ng/mL on 12/27/2014 (8.8) 4.1 ng/mL on 06/30/2015 (8.2) 6.0 ng/mL on 03/13/2017(12.0) 7.3 ng/mL on 11/27/2017 (14.6) 8.4 ng/mL on 04/14/2018 (16.8)    11.6 ng/mL on 08/11/2019 (23.2)    12.08 ng/mL on 10/19/19  (24.16)  PMH: Past Medical History:  Diagnosis Date  . Acute appendicitis    perforated  . Anal fistula   . Chronic kidney disease   . Diverticulitis   . Hemorrhoids, internal   . History of kidney stones   . Hypertension   . Inguinal hernia     Surgical History: Past Surgical History:  Procedure Laterality Date  . APPENDECTOMY    . colonoscopy 2010    . HERNIA REPAIR  02/04/2012   Dr. Burt Knack  . INGUINAL HERNIA REPAIR  2013   Dr. Burt Knack  . INGUINAL HERNIA REPAIR Left 05/06/2019   Procedure: HERNIA REPAIR INGUINAL ADULT OPEN WITH MESH, LEFT;  Surgeon: Herbert Pun, MD;  Location: [facility] ORS;  Service: General;  Laterality: Left;  . LAPAROSCOPIC APPENDECTOMY  07/03/2011   Dr Burt Knack  . RECTAL SURGERY  08/08/2009   Dr. Pat Patrick    Home Medications:  Allergies as of 11/25/2019      Reactions   Clindamycin/lincomycin Rash   Tachycardia      Medication List       Accurate as of November 25, 2019 10:15 AM. If you have any questions, ask your nurse or doctor.        dutasteride 0.5 MG capsule Commonly known as: AVODART TAKE 1 CAPSULE BY MOUTH AT BEDTIME   hydrochlorothiazide 25 MG tablet Commonly known as: HYDRODIURIL Take 25 mg by mouth at bedtime.   lovastatin 40 MG tablet Commonly known as: MEVACOR Take 80 mg by mouth at bedtime.   tamsulosin 0.4 MG Caps capsule Commonly known as: FLOMAX Take 1 capsule (0.4 mg total) by mouth daily.       Allergies:  Allergies  Allergen Reactions  . Clindamycin/Lincomycin Rash    Tachycardia     Family History: Family History  Problem Relation Age of Onset  . Hypertension Mother   . Cancer Father        prostate  . Hypertension Father   . Kidney disease Neg Hx   . Kidney cancer Neg Hx   . Bladder Cancer Neg Hx     Social History:  reports that  he has never smoked. He has never used smokeless tobacco. He reports current alcohol use. He reports that he does not use drugs.  ROS: UROLOGY Frequent Urination?: No Hard to postpone urination?: No Burning/pain with urination?: No Get up at night to urinate?: No Leakage of urine?: No Urine stream starts and stops?: No Trouble starting stream?: No Do you have to strain to urinate?: No Blood in urine?: No Urinary tract infection?: No Sexually transmitted disease?: No Injury to kidneys or bladder?: No Painful intercourse?: No Weak stream?: No Erection problems?: No Penile pain?: No  Gastrointestinal Nausea?: No Vomiting?: No Indigestion/heartburn?: No Diarrhea?: No Constipation?: No  Constitutional Fever: No Night sweats?:  No Weight loss?: No Fatigue?: No  Skin Skin rash/lesions?: No Itching?: No  Eyes Blurred vision?: No Double vision?: No  Ears/Nose/Throat Sore throat?: No Sinus problems?: No  Hematologic/Lymphatic Swollen glands?: No Easy bruising?: No  Cardiovascular Leg swelling?: No Chest pain?: No  Respiratory Cough?: No Shortness of breath?: No  Endocrine Excessive thirst?: No  Musculoskeletal Back pain?: No Joint pain?: No  Neurological Headaches?: No Dizziness?: No  Psychologic Depression?: No Anxiety?: No  Physical Exam: BP (!) 156/77   Pulse 96   Ht 5\' 8"  (1.727 m)   Wt 186 lb (84.4 kg)   BMI 28.28 kg/m   Constitutional:  Alert and oriented, No acute distress. HEENT: [place] AT, moist mucus membranes.  Trachea midline, no masses. Cardiovascular: No clubbing, cyanosis, or edema. Respiratory: Normal respiratory effort, no increased work of breathing. Neurologic: Grossly intact, no focal deficits, moving all 4 extremities. Psychiatric: Normal mood and affect.  Laboratory Data: Lab Results  Component Value Date   WBC 11.7 (H) 12/27/2018   HGB 15.2 12/27/2018   HCT 43.6 12/27/2018   MCV 82.0 12/27/2018   PLT 338 12/27/2018    Lab Results  Component Value Date   CREATININE 1.67 (H) 12/27/2018   PSA trend as above  Assessment & Plan:    1. Elevated PSA/ rising PSA Status post 4 previous negative prostate biopsies as well as fairly unremarkable prostate MRI in 2019  We discussed today that his overall trend continues to be worrisome and the PSA acceleration/rise is concerning along with the overall trend.  We discussed the role of repeat biopsy at length.  He has declined this at this time.  He would be open to repeating the MRI, will return to discuss results. - MR PROSTATE W WO CONTRAST; Future  2. BPH with obstruction/lower urinary tract symptoms Given his history of ongoing urinary symptoms and recurrent bladder stones, patient would be much  benefit from an outlet procedure.  He may be more open to this after our discussion today than previously.  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  We will continue this discussion at our next follow-up.  He is agreeable this plan.  3. Bladder stones As above   Return in about 6 weeks (around 01/06/2020) for Prostate MR with IPSS, UA. Hollice Espy, MD  [facility] [address] [place], [place] [address] [phone]

## 2019-12-09 ENCOUNTER — Other Ambulatory Visit: Payer: Self-pay

## 2019-12-09 ENCOUNTER — Ambulatory Visit
Admission: RE | Admit: 2019-12-09 | Discharge: 2019-12-09 | Disposition: A | Discharge status: Home / Self Care | Payer: Medicare Other | Source: Ambulatory Visit | Attending: Urology | Admitting: Urology

## 2019-12-09 DIAGNOSIS — R972 Elevated prostate specific antigen [PSA]: Secondary | ICD-10-CM | POA: Insufficient documentation

## 2019-12-09 MED ORDER — GADOBUTROL 1 MMOL/ML IV SOLN
7.5000 mL | Freq: Once | INTRAVENOUS | Status: AC | PRN
  Administered 2019-12-09: 10:00:00 7.5 mL via INTRAVENOUS

## 2019-12-22 IMAGING — US US EXTREM LOW*L* LIMITED
1 series · 14 of 20 positions shown · non-contrast
Comparison: CT 12/26/2018.  Ultrasound 01/04/2015.

CLINICAL DATA: Left inguinal canal swelling.

EXAM:
ULTRASOUND LEFT LOWER EXTREMITY LIMITED
TECHNIQUE: Ultrasound examination of the lower extremity soft tissues was
performed in the area of clinical concern.

[Series 1: us extrem low*left* limited · 0.08mm/px · 20 acquisitions, 14 frames shown]
[im 1/20]
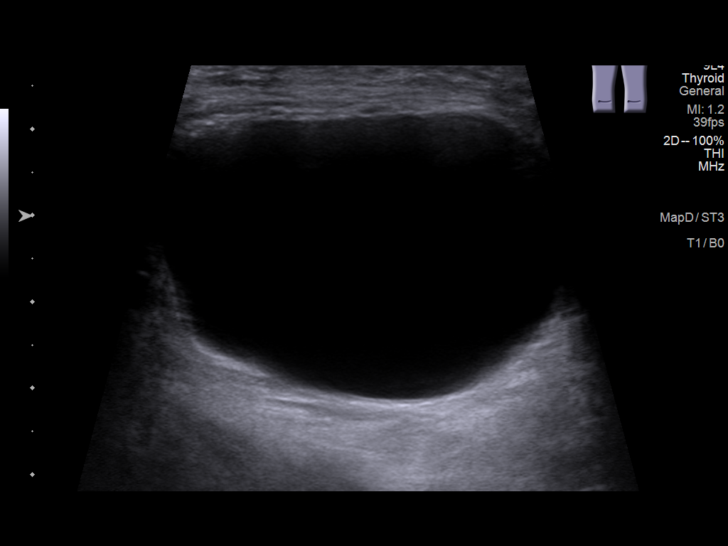
[im 3/20]
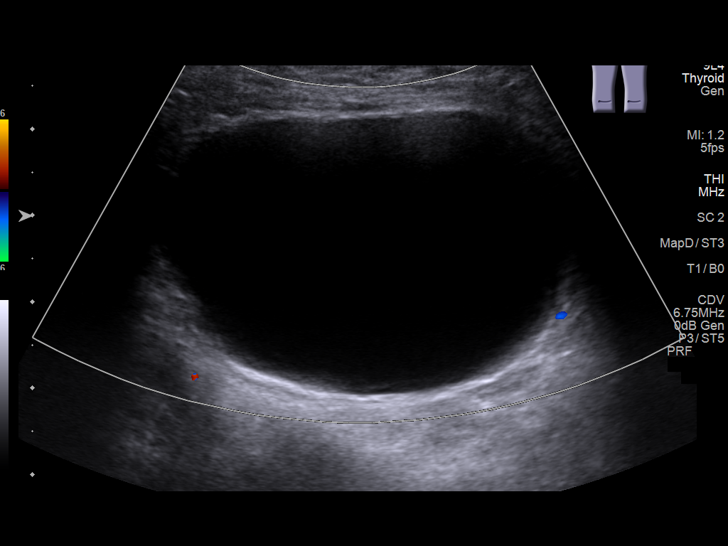
[im 4/20]
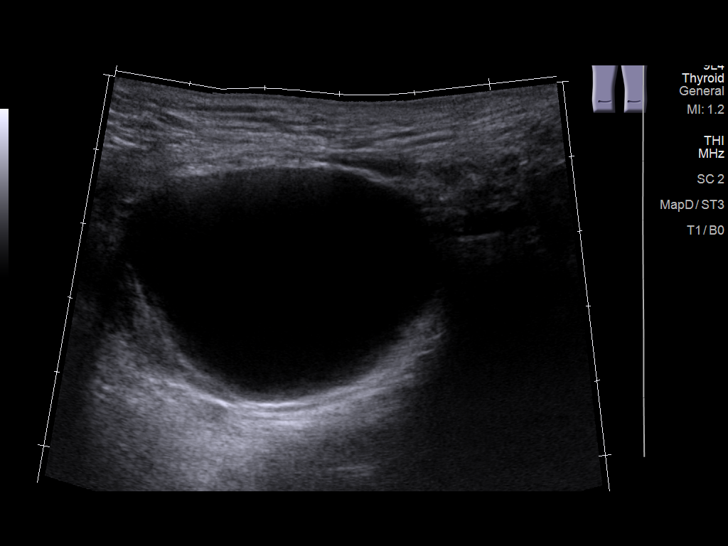
[im 6/20]
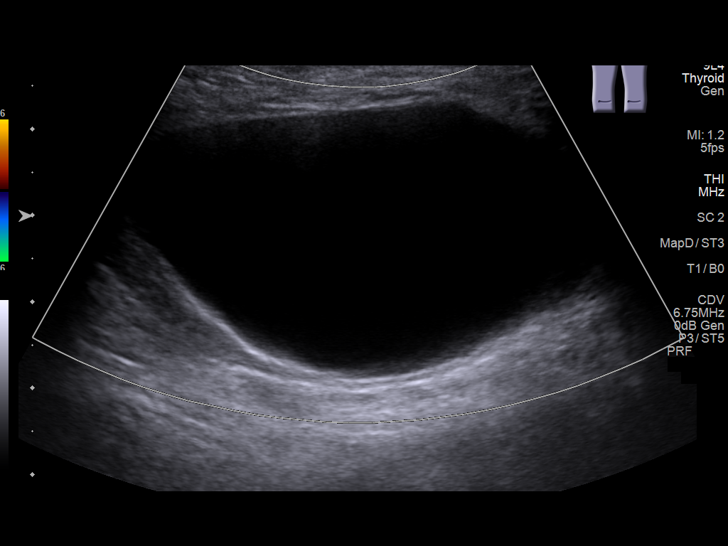
[im 7/20]
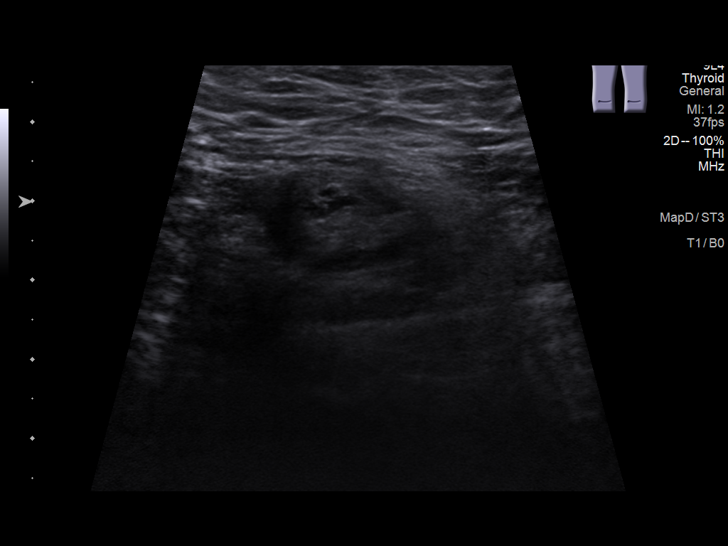
[im 8/20]
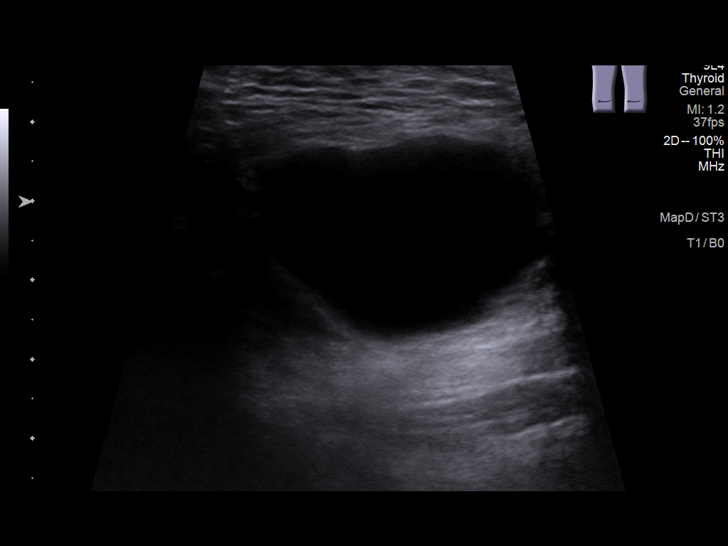
[im 10/20]
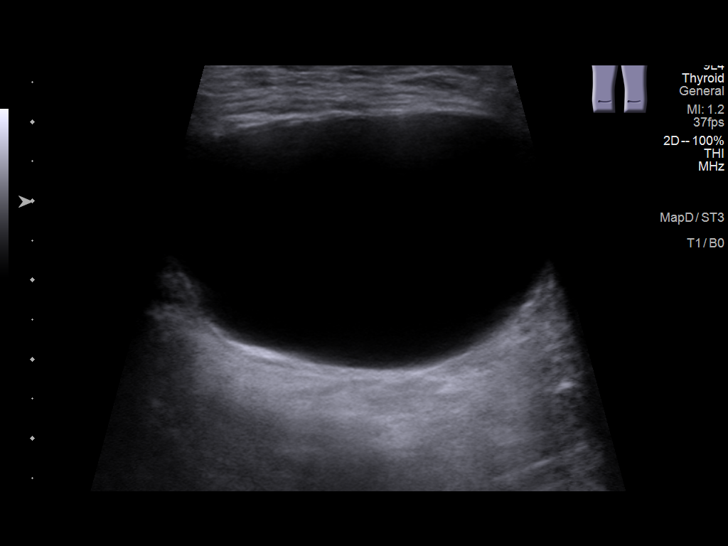
[im 11/20]
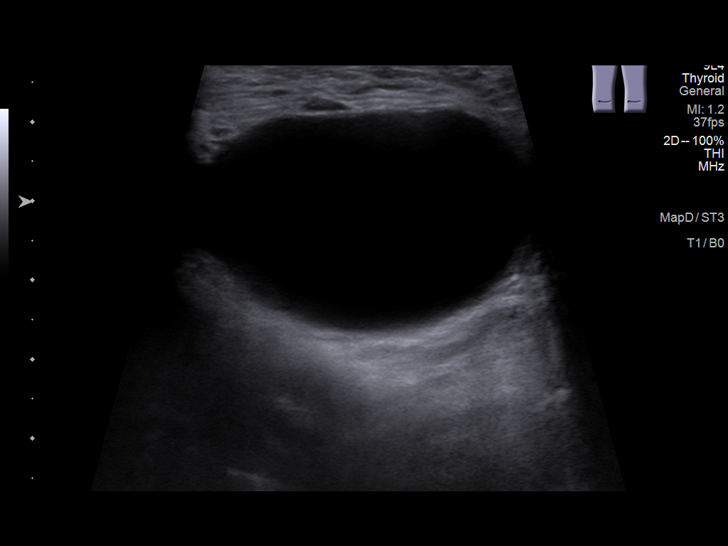
[im 13/20]
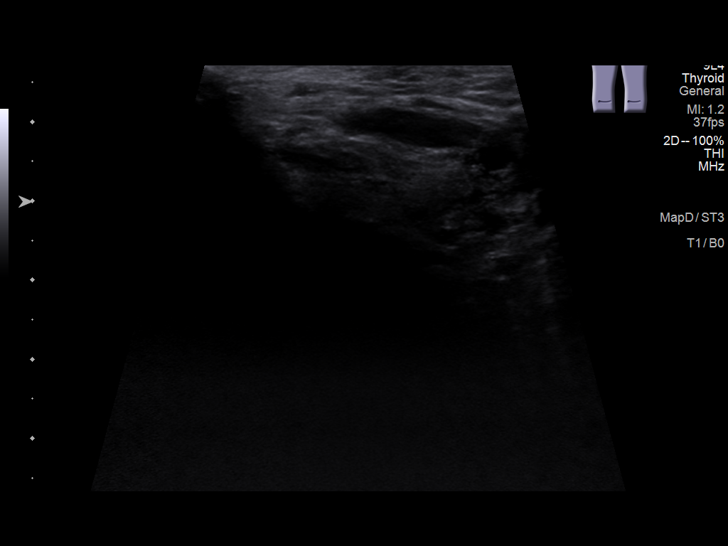
[im 14/20]
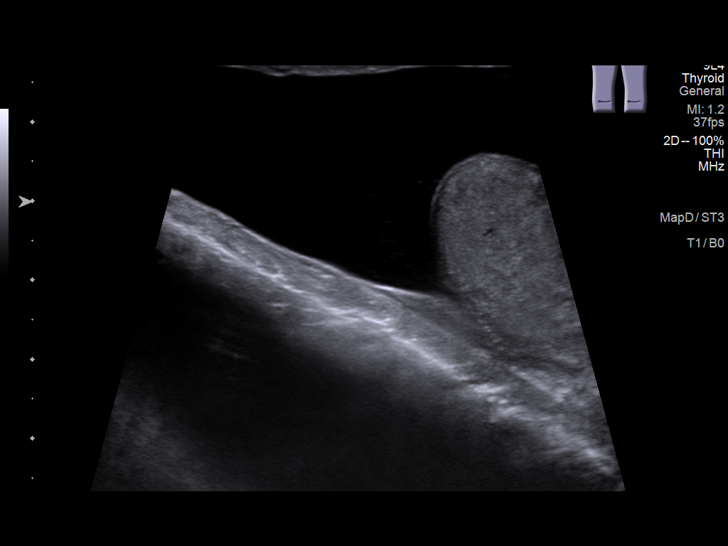
[im 16/20]
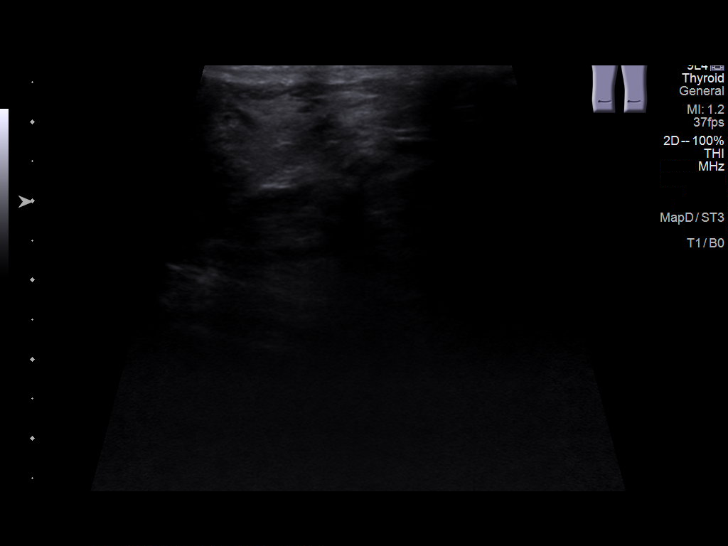
[im 17/20]
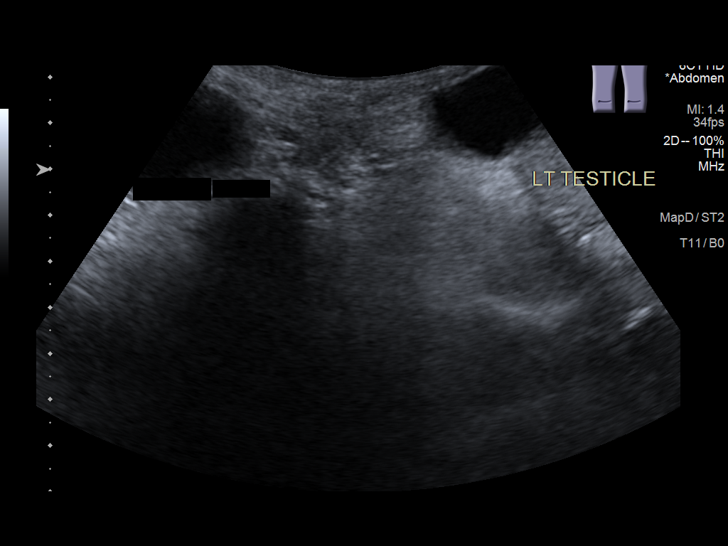
[im 18/20]
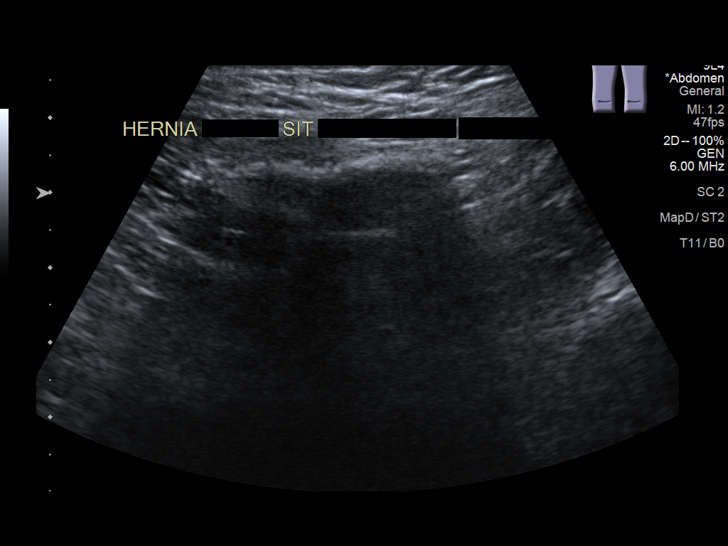
[im 20/20]
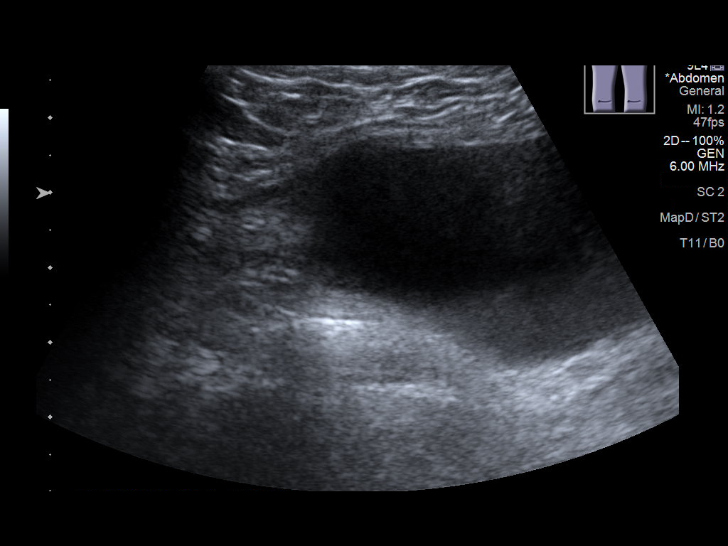

[14 of 20 positions shown; findings below may reference images not displayed]

FINDINGS: A 4.5 x 4.7 x 3.3 cm cystic mass is noted in the left inguinal
region. Similar finding noted on CT of 12/26/2018. This appears to
be separate from the scrotal sac. No definite inguinal hernia noted.
A left hydrocele is also noted.
IMPRESSION: 1. 4.5 x 4.7 x 3.3 cm cystic mass noted the left inguinal region.
This appears to be separate from the scrotal sac. Similar finding
noted on CT of 12/26/2018. No definite inguinal hernia noted.

2.  Left hydrocele also noted.

## 2020-01-04 NOTE — Progress Notes (Signed)
01/05/2020 11:31 AM   [name] 24-Dec-1940 [id number]  Referring provider: Marguerita Merles, [place],  [place] [address]  Chief Complaint  Patient presents with  . Follow-up    HPI: [name] is a 79 yo [place] American M with a personal history of elevated/rising PSA, BPH and bladder stones returns today to discuss his MRI results.   His status post 4 previous prostate biopsies all of which have been negative.  He underwent a prostate MRI in 2019 which was also fairly unremarkable.  Despite this, his PSA continues to rise steadily as outlined below.  He is managed on maximal medical therapy in the form of dutasteride and Flomax.  His clinical symptoms are reasonably well controlled although he did continues to make bladder stones.  In the past, he has declined surgical intervention but may be more open to this in the future.  His MRI from 12/09/2019 shows a 2.4 cm lesion at the extreme right anterior apex of the prostate (PI-RADS 5), additional 10mm lesion at right lateral apex of peripheral zone and thick-walled bladder with mucosal enhancement, possible reflecting inflammatory changes given multiple bladder calculi.   Pt reports bladder stones. He states that he has passed 3 kidney stones. He occasionally passes stones spontaneously and he is occasionally symptomatic with good urinary stream which suddenly cuts off. Stones were not visible in most recent KUB in December but did have multiple small stone on CT in Feb 2020.   PSA History:  6.8 ng/mL on 03/02/2013-patient started on dutasteride  3.5 ng/mL on 07/20/2013 (7.0)  4.3 ng/mL on 06/07/2014 (8.6) 4.4 ng/mL on 12/27/2014 (8.8) 4.1 ng/mL on 06/30/2015 (8.2) 6.0 ng/mL on 03/13/2017(12.0) 7.3 ng/mL on 11/27/2017 (14.6) 8.4 ng/mL on 04/14/2018 (16.8)    11.6 ng/mL on 08/11/2019 (23.2)    12.08 ng/mL on 10/19/19  (24.16)  PMH: Past Medical History:  Diagnosis Date  . Acute  appendicitis    perforated  . Anal fistula   . Chronic kidney disease   . Diverticulitis   . Hemorrhoids, internal   . History of kidney stones   . Hypertension   . Inguinal hernia     Surgical History: Past Surgical History:  Procedure Laterality Date  . APPENDECTOMY    . colonoscopy 2010    . HERNIA REPAIR  02/04/2012   Dr. Burt Knack  . INGUINAL HERNIA REPAIR  2013   Dr. Burt Knack  . INGUINAL HERNIA REPAIR Left 05/06/2019   Procedure: HERNIA REPAIR INGUINAL ADULT OPEN WITH MESH, LEFT;  Surgeon: Herbert Pun, MD;  Location: [facility] ORS;  Service: General;  Laterality: Left;  . LAPAROSCOPIC APPENDECTOMY  07/03/2011   Dr Burt Knack  . RECTAL SURGERY  08/08/2009   Dr. Pat Patrick    Home Medications:  Allergies as of 01/05/2020      Reactions   Clindamycin/lincomycin Rash   Tachycardia      Medication List       Accurate as of January 05, 2020 11:31 AM. If you have any questions, ask your nurse or doctor.        dutasteride 0.5 MG capsule Commonly known as: AVODART TAKE 1 CAPSULE BY MOUTH AT BEDTIME   hydrochlorothiazide 25 MG tablet Commonly known as: HYDRODIURIL Take 25 mg by mouth at bedtime.   lovastatin 40 MG tablet Commonly known as: MEVACOR Take 80 mg by mouth at bedtime.   tamsulosin 0.4 MG Caps capsule Commonly known as: FLOMAX Take 1 capsule (0.4 mg total) by mouth daily.  Allergies:  Allergies  Allergen Reactions  . Clindamycin/Lincomycin Rash    Tachycardia     Family History: Family History  Problem Relation Age of Onset  . Hypertension Mother   . Cancer Father        prostate  . Hypertension Father   . Kidney disease Neg Hx   . Kidney cancer Neg Hx   . Bladder Cancer Neg Hx     Social History:  reports that he has never smoked. He has never used smokeless tobacco. He reports current alcohol use. He reports that he does not use drugs.   Physical Exam: BP (!) 165/82   Pulse 92   Ht 5\' 8"  (1.727 m)   Wt 185 lb (83.9 kg)   BMI 28.13  kg/m   Constitutional:  Alert and oriented, No acute distress. HEENT: [place] AT, moist mucus membranes.  Trachea midline, no masses. Cardiovascular: No clubbing, cyanosis, or edema. Respiratory: Normal respiratory effort, no increased work of breathing. Skin: No rashes, bruises or suspicious lesions. Neurologic: Grossly intact, no focal deficits, moving all 4 extremities. Psychiatric: Normal mood and affect.  Pertinent Imaging: CLINICAL DATA:  Elevated PSA, urinary frequency  EXAM: MR PROSTATE WITHOUT AND WITH CONTRAST  TECHNIQUE: Multiplanar multisequence MRI images were obtained of the pelvis centered about the prostate. Pre and post contrast images were obtained.  CONTRAST:  7.3mL GADAVIST GADOBUTROL 1 MMOL/ML IV SOLN  COMPARISON:  MR prostate dated 07/18/2018  FINDINGS: Prostate: 2.4 x 1.4 x 1.7 cm focal low T2 lesion at the extreme right anterior apex of the prostate (series 7/image 18). It is unclear whether this arises from the anterior horn of the right peripheral zone (favored) or whether this arises from the transitional zone.  Severe restricted diffusion/low ADC (series 10/image 20). Associated early arterial enhancement (series 27/image 22). PI-RADS 5.  10 x 6 x 7 mm focal low T2 lesion at the right lateral apex of the peripheral zone (series 7/image 18). Mild to moderate restricted diffusion/low ADC (series 27/image 20). Suspected mild early arterial enhancement in this region (series 36/image 15). PI-RADS 4.  Additional well circumscribed prior enhancing nodule along the lateral aspect of the left mid gland (series 27/image 14), favored to correspond to an exophytic BPH nodule (series 7/image 12).  Enlargement/nodularity of the central gland, suggesting BPH. No suspicious central gland nodule on T2.  Volume: 5.1 x 4.0 x 4.2 cm (calculated volume 42.9 mL).  Transcapsular spread: Right anterior apical lesion transgresses the anterior fibromuscular  stroma.  Seminal vesicle involvement: Absent.  Neurovascular bundle involvement: Absent.  Pelvic adenopathy: Absent.  Bone metastasis: Absent.  Other findings: Thick-walled bladder with mucosal enhancement, possibly reflecting inflammatory changes given multiple layering bladder calculi. Consider cystoscopy as clinically warranted.  Postsurgical changes related to prior right inguinal hernia repair. Fluid within the distal left inguinal canal (series 7/image 25).  IMPRESSION: 2.4 cm lesion at the extreme right anterior apex of the prostate (PI-RADS 5). This lesion transgresses the anterior fibromuscular stroma.  Additional 10 mm lesion at the right lateral apex of the peripheral zone (PI-RADS 4).  No evidence of seminal vesicle invasion, lymphadenopathy, or metastatic disease.  Thick-walled bladder with mucosal enhancement, possibly reflecting inflammatory changes given multiple layering bladder calculi. Consider cystoscopy as clinically warranted.   Electronically Signed   By: Julian Hy M.D.   On: 12/09/2019 12:11   I have personally reviewed the images and agree with radiologist interpretation.    Assessment & Plan:    1. Elevated PSA/rising  PSA  MRI worrisome and discussed that his overall trend continues to be worrisome and the PSA acceleration/rise is concerning along with the overall trend.  Discussed the role of repeat biopsy at length Recommend fusion biopsy at [facility] for most accurate biopsy Lengthy discussion regarding the process of fusion biopsy and the associated risk and benefits of fusion biopsy  Argrees with this plan  2. BPH with obstruction/lower urinary tract symptoms Given his history of ongoing urinary symptoms and recurrent bladder stones, patient would benefit from an outlet procedure. Will discuss management after fusion biopsy   3. 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 Hollice Espy,  I have reviewed the above documentation for accuracy and completeness, and I agree with the above.   Hollice Espy, MD  I spent 40 total minutes on the day of the encounter including pre-visit review of the medical record, face-to-face time with the patient, and post visit ordering of labs/imaging/tests.

## 2020-01-05 ENCOUNTER — Other Ambulatory Visit: Payer: Self-pay

## 2020-01-05 ENCOUNTER — Encounter: Payer: Self-pay | Admitting: Urology

## 2020-01-05 ENCOUNTER — Ambulatory Visit (INDEPENDENT_AMBULATORY_CARE_PROVIDER_SITE_OTHER): Payer: Medicare Other | Admitting: Urology

## 2020-01-05 VITALS — BP 165/82 | HR 92 | Ht 68.0 in | Wt 185.0 lb

## 2020-01-05 DIAGNOSIS — R972 Elevated prostate specific antigen [PSA]: Secondary | ICD-10-CM

## 2020-01-11 IMAGING — CR DG ABDOMEN 1V
2 series · 2 of 2 positions shown · non-contrast
Comparison: 12/28/2018

CLINICAL DATA: Bladder stones. Gross hematuria.

EXAM:
ABDOMEN - 1 VIEW

[abdomen kub (1 of 2)]
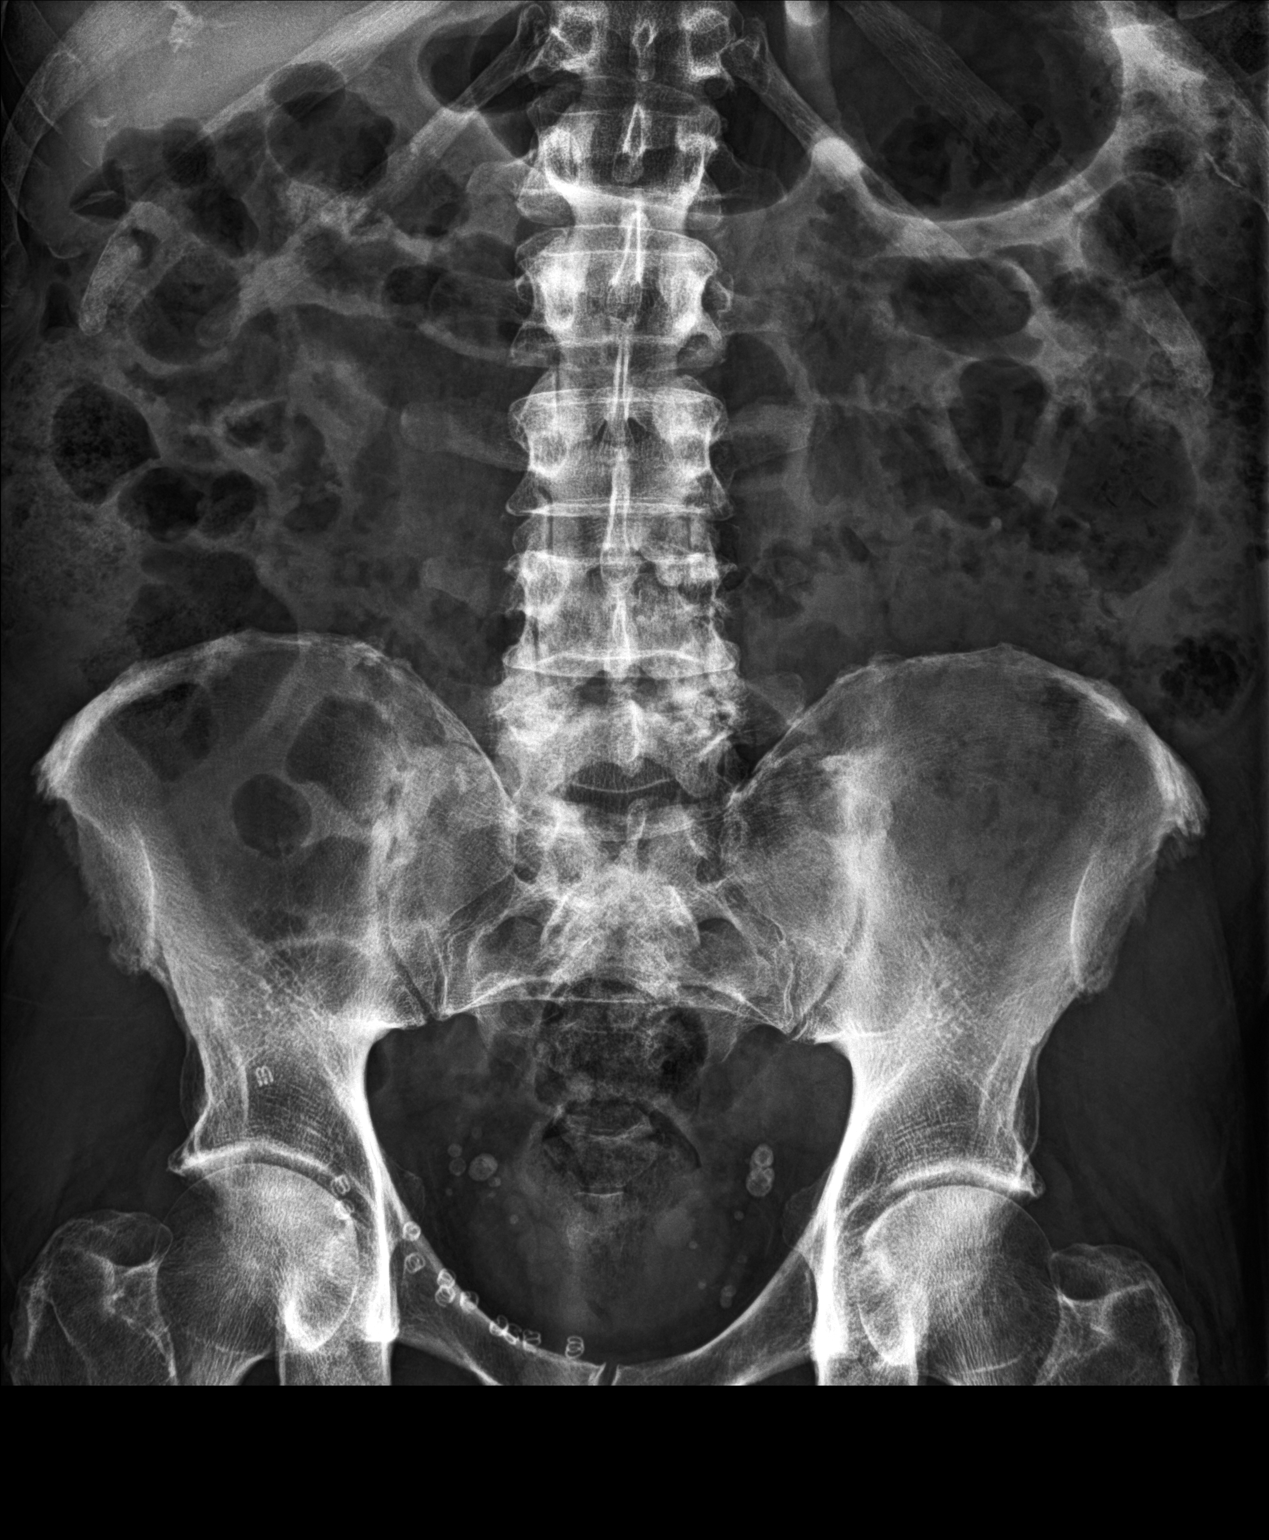

[abdomen kub (2 of 2)]
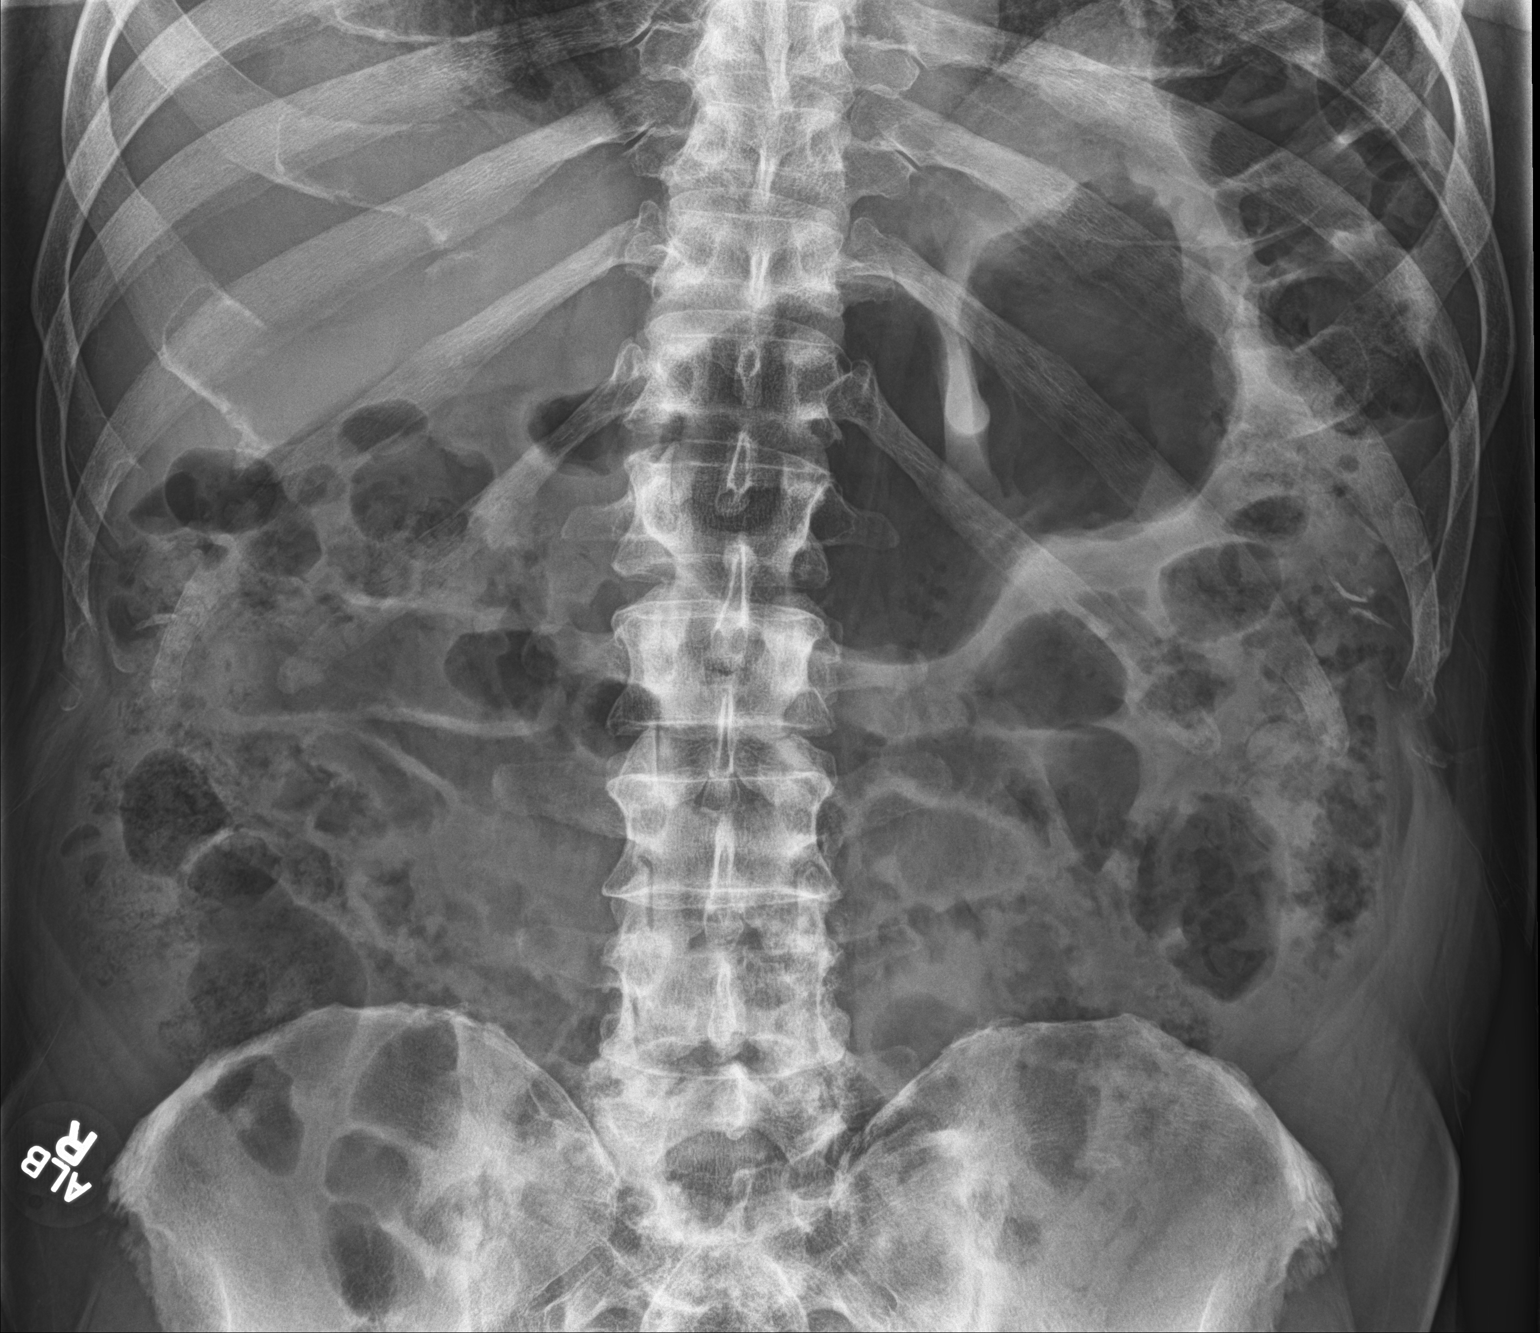

[2 of 2 positions shown; findings below may reference images not displayed]

FINDINGS: Supine abdomen shows diffuse gaseous distention of small bowel with
air in stool scattered along the length of the colon. Gas pattern is
not overtly obstructive. Multiple phleboliths are seen over the
inferior pelvis. Mesh anchors in the right lower quadrant suggest
prior herniorrhaphy. Visualized bony anatomy unremarkable.
IMPRESSION: No substantial change in diffuse gaseous small bowel distention with
air in stool in the colon. Diffuse gaseous distention of small bowel
with air in stool scattered along the length of the colon. Gas
pattern is not overtly obstructive although a component of mild
ileus cannot be excluded.

## 2020-02-01 ENCOUNTER — Other Ambulatory Visit: Payer: Self-pay | Admitting: *Deleted

## 2020-02-01 DIAGNOSIS — R972 Elevated prostate specific antigen [PSA]: Secondary | ICD-10-CM

## 2020-02-02 ENCOUNTER — Other Ambulatory Visit: Payer: Self-pay | Admitting: Urology

## 2020-02-02 ENCOUNTER — Encounter (INDEPENDENT_AMBULATORY_CARE_PROVIDER_SITE_OTHER): Payer: Self-pay

## 2020-02-02 ENCOUNTER — Other Ambulatory Visit: Payer: Self-pay

## 2020-02-02 ENCOUNTER — Other Ambulatory Visit: Payer: Medicare Other

## 2020-02-02 DIAGNOSIS — R972 Elevated prostate specific antigen [PSA]: Secondary | ICD-10-CM

## 2020-02-03 LAB — PSA: Prostate Specific Ag, Serum: 17.7 ng/mL — ABNORMAL HIGH (ref 0.0–4.0)

## 2020-02-09 ENCOUNTER — Encounter: Payer: Self-pay | Admitting: Urology

## 2020-02-09 ENCOUNTER — Other Ambulatory Visit: Payer: Self-pay

## 2020-02-09 ENCOUNTER — Ambulatory Visit (INDEPENDENT_AMBULATORY_CARE_PROVIDER_SITE_OTHER): Payer: Medicare Other | Admitting: Urology

## 2020-02-09 VITALS — BP 151/94 | HR 94 | Ht 68.0 in | Wt 185.0 lb

## 2020-02-09 DIAGNOSIS — C61 Malignant neoplasm of prostate: Secondary | ICD-10-CM | POA: Diagnosis not present

## 2020-02-09 NOTE — Patient Instructions (Signed)
Leuprolide injection What is this medicine? LEUPROLIDE (loo PROE lide) is a man-made hormone. It is used to treat the symptoms of prostate cancer. This medicine may also be used to treat children with early onset of puberty. It may be used for other hormonal conditions. This medicine may be used for other purposes; ask your health care provider or pharmacist if you have questions. COMMON BRAND NAME(S): Lupron What should I tell my health care provider before I take this medicine? They need to know if you have any of these conditions:  diabetes  heart disease or previous heart attack  high blood pressure  high cholesterol  pain or difficulty passing urine  spinal cord metastasis  stroke  tobacco smoker  an unusual or allergic reaction to leuprolide, benzyl alcohol, other medicines, foods, dyes, or preservatives  pregnant or trying to get pregnant  breast-feeding How should I use this medicine? This medicine is for injection under the skin or into a muscle. You will be taught how to prepare and give this medicine. Use exactly as directed. Take your medicine at regular intervals. Do not take your medicine more often than directed. It is important that you put your used needles and syringes in a special sharps container. Do not put them in a trash can. If you do not have a sharps container, call your pharmacist or healthcare provider to get one. A special MedGuide will be given to you by the pharmacist with each prescription and refill. Be sure to read this information carefully each time. Talk to your pediatrician regarding the use of this medicine in children. While this medicine may be prescribed for children as young as 8 years for selected conditions, precautions do apply. Overdosage: If you think you have taken too much of this medicine contact a poison control center or emergency room at once. NOTE: This medicine is only for you. Do not share this medicine with others. What if  I miss a dose? If you miss a dose, take it as soon as you can. If it is almost time for your next dose, take only that dose. Do not take double or extra doses. What may interact with this medicine? Do not take this medicine with any of the following medications:  chasteberry This medicine may also interact with the following medications:  herbal or dietary supplements, like black cohosh or DHEA  male hormones, like estrogens or progestins and birth control pills, patches, rings, or injections  male hormones, like testosterone This list may not describe all possible interactions. Give your health care provider a list of all the medicines, herbs, non-prescription drugs, or dietary supplements you use. Also tell them if you smoke, drink alcohol, or use illegal drugs. Some items may interact with your medicine. What should I watch for while using this medicine? Visit your doctor or health care professional for regular checks on your progress. During the first week, your symptoms may get worse, but then will improve as you continue your treatment. You may get hot flashes, increased bone pain, increased difficulty passing urine, or an aggravation of nerve symptoms. Discuss these effects with your doctor or health care professional, some of them may improve with continued use of this medicine. Male patients may experience a menstrual cycle or spotting during the first 2 months of therapy with this medicine. If this continues, contact your doctor or health care professional. This medicine may increase blood sugar. Ask your healthcare provider if changes in diet or medicines are needed if   you have diabetes. What side effects may I notice from receiving this medicine? Side effects that you should report to your doctor or health care professional as soon as possible:  allergic reactions like skin rash, itching or hives, swelling of the face, lips, or tongue  breathing problems  chest  pain  depression or memory disorders  pain in your legs or groin  pain at site where injected  severe headache  signs and symptoms of high blood sugar such as being more thirsty or hungry or having to urinate more than normal. You may also feel very tired or have blurry vision  swelling of the feet and legs  visual changes  vomiting Side effects that usually do not require medical attention (report to your doctor or health care professional if they continue or are bothersome):  breast swelling or tenderness  decrease in sex drive or performance  diarrhea  hot flashes  loss of appetite  muscle, joint, or bone pains  nausea  redness or irritation at site where injected  skin problems or acne This list may not describe all possible side effects. Call your doctor for medical advice about side effects. You may report side effects to FDA at 1-800-FDA-1088. Where should I keep my medicine? Keep out of the reach of children. Store below 25 degrees C (77 degrees F). Do not freeze. Protect from light. Do not use if it is not clear or if there are particles present. Throw away any unused medicine after the expiration date. NOTE: This sheet is a summary. It may not cover all possible information. If you have questions about this medicine, talk to your doctor, pharmacist, or health care provider.  2020 Elsevier/Gold Standard (2018-08-21 09:52:48)  

## 2020-02-09 NOTE — H&P (View-Only) (Signed)
02/09/20 9:07 AM   [name] [birth year] [id number]  Referring provider: Marguerita Merles, [place] [place] [place],  [place] [address]  Chief Complaint  Patient presents with  . Results    HPI: [name] is a 79 y.o. M who returns today for the evaluation and management of prostate cancer s/p fusion bx.   His status post 4 previous prostate biopsies all of which have been negative. He underwent a prostate MRI in 2019 which was also fairly unremarkable. Despite this, his PSA continues to rise steadily as outlined below.  He is managed on maximal medical therapy in the form of dutasteride and Flomax. His clinical symptoms are reasonably well controlled although he did continues to make bladder stones. In the past, he has declined surgical intervention but may be more open to this in the future.  His MRI from 12/09/2019 shows a 2.4 cm lesion at the extreme right anterior apex of the prostate (PI-RADS 5), additional 57mm lesion at right lateral apex of peripheral zone and thick-walled bladder with mucosal enhancement, possible reflecting inflammatory changes given multiple bladder calculi.   He has passed 3 stones during this time. Stones were not visible in most recent KUB in December but did have multiple small stone on CT in Feb 2020.   He underwent a fusion prostate bx on 01/25/20. His path report indicates 2 of 3 cores involved Gleason score 4+3 up to 60% of tissue primarily at left mid lateral left apex lateral and left mid. Prostate 43g.   PSA from 02/02/20 was 17.7.  This was closely following biopsy, unclear why this was drawn.  PSA History:  6.8 ng/mL on 03/02/2013-patient started on dutasteride  3.5 ng/mL on 07/20/2013 (7.0)  4.3 ng/mL on 06/07/2014 (8.6) 4.4 ng/mL on 12/27/2014 (8.8) 4.1 ng/mL on 06/30/2015 (8.2) 6.0 ng/mL on 03/13/2017(12.0) 7.3 ng/mL on 11/27/2017 (14.6) 8.4 ng/mL on 04/14/2018 (16.8) 11.6 ng/mL on 08/11/2019 (23.2)  12.08 ng/mL on 10/19/19 (24.16)    17.7 ng/mL on 02/02/20.   PMH: Past Medical History:  Diagnosis Date  . Acute appendicitis    perforated  . Anal fistula   . Chronic kidney disease   . Diverticulitis   . Hemorrhoids, internal   . History of kidney stones   . Hypertension   . Inguinal hernia     Surgical History: Past Surgical History:  Procedure Laterality Date  . APPENDECTOMY    . colonoscopy 2010    . HERNIA REPAIR  02/04/2012   Dr. Burt Knack  . INGUINAL HERNIA REPAIR  2013   Dr. Burt Knack  . INGUINAL HERNIA REPAIR Left 05/06/2019   Procedure: HERNIA REPAIR INGUINAL ADULT OPEN WITH MESH, LEFT;  Surgeon: Herbert Pun, MD;  Location: [facility] ORS;  Service: General;  Laterality: Left;  . LAPAROSCOPIC APPENDECTOMY  07/03/2011   Dr Burt Knack  . RECTAL SURGERY  08/08/2009   Dr. Pat Patrick    Home Medications:  Allergies as of 02/09/2020      Reactions   Clindamycin/lincomycin Rash   Tachycardia      Medication List       Accurate as of February 09, 2020 11:59 PM. If you have any questions, ask your nurse or doctor.        STOP taking these medications   tamsulosin 0.4 MG Caps capsule Commonly known as: FLOMAX Stopped by: Hollice Espy, MD     TAKE these medications   dutasteride 0.5 MG capsule Commonly known as: AVODART TAKE 1 CAPSULE BY MOUTH AT BEDTIME  hydrochlorothiazide 25 MG tablet Commonly known as: HYDRODIURIL Take 25 mg by mouth at bedtime.   lovastatin 40 MG tablet Commonly known as: MEVACOR Take 80 mg by mouth at bedtime.       Allergies:  Allergies  Allergen Reactions  . Clindamycin/Lincomycin Rash    Tachycardia     Family History: Family History  Problem Relation Age of Onset  . Hypertension Mother   . Cancer Father        prostate  . Hypertension Father   . Kidney disease Neg Hx   . Kidney cancer Neg Hx   . Bladder Cancer Neg Hx     Social History:  reports that he has never smoked. He has never used smokeless tobacco. He  reports current alcohol use. He reports that he does not use drugs.   Physical Exam: BP (!) 151/94   Pulse 94   Ht 5\' 8"  (1.727 m)   Wt 185 lb (83.9 kg)   BMI 28.13 kg/m   Constitutional:  Alert and oriented, No acute distress. HEENT: [place] AT, moist mucus membranes.  Trachea midline, no masses. Cardiovascular: No clubbing, cyanosis, or edema. Respiratory: Normal respiratory effort, no increased work of breathing. Skin: No rashes, bruises or suspicious lesions. Neurologic: Grossly intact, no focal deficits, moving all 4 extremities. Psychiatric: Normal mood and affect.  Assessment & Plan:    1. Prostate cancer  PSA from 02/02/20 was 17.7 (Not sure why blood work was done however expected elevation due to recent prostate bx)   Reviewed fusion bx path report indicating intermediate risk prostate cancer   The patient was counseled about the natural history of prostate cancer and the standard treatment options that are available for prostate cancer. It was explained to him how his age and life expectancy, clinical stage, Gleason score, and PSA affect his prognosis, the decision to proceed with additional staging studies, as well as how that information influences recommended treatment strategies. We discussed the roles for active surveillance, radiation therapy, surgical therapy, androgen deprivation, as well as ablative therapy options for the treatment of prostate cancer as appropriate to his individual cancer situation. We discussed the risks and benefits of these options with regard to their impact on cancer control and also in terms of potential adverse events, complications, and impact on quality of life particularly related to urinary, bowel, and sexual function. The patient was encouraged to ask questions throughout the discussion today and all questions were answered to his stated satisfaction. In addition, the patient was provided with and/or directed to appropriate resources and  literature for further education about prostate cancer treatment options.  Staged with MRI so no further staging necessary and no adenopathy however extracapsular tumor noted.   Recommended radiation with ADT in 6 months and will discuss with Dr. Baruch Gouty for further evaluation. Literature regarding Lupron injections given to patient.   Pt lives in [place], [place] so daily treatment possible barrier, will discuss further with Dr. Donella Stade regarding options  Ideally, we could treat his outlet obstruction and bladder stones prior to definitive treatment for his prostate cancer in order to optimize his symptoms and recurrent stones  2. BPH with obstruction/lower urinary tract symptoms  Encouraged bladder outlet procedure to address obstructed outcome.    Discussed Urolift procedure with risks and benefits as well as cystolitholapaxy. -These could be as fiducial markers for EBRT.  Alternatives including TURP/ HoLEP were also discussed.  He understands all risks including bleeding, infection, damage surrounding structures, need for further procedures,  possible need for Foley catheter amongst others. All questions answered. He was given literature on the procedure outlining the above as well.    Will discuss with Dr. Baruch Gouty for further evaluation and recommended sequence of events.    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 Hollice Espy,  I have reviewed the above documentation for accuracy and completeness, and I agree with the above.   Hollice Espy, MD  I spent 50 total minutes on the day of the encounter including pre-visit review of the medical record, face-to-face time with the patient, and post visit ordering of labs/imaging/tests.  Case was also subsequently discussed with Dr. Baruch Gouty later in the  day who agrees that your left initiation of ADT with cystolitholapaxy prior to EBRT would be ideal.  Will call patient to discuss further.

## 2020-02-09 NOTE — Progress Notes (Signed)
02/09/20 9:07 AM   [name] 06-13-1941 [id number]  Referring provider: Marguerita Merles, [place],  [place] [address]  Chief Complaint  Patient presents with  . Results    HPI: [name] is a 79 y.o. M who returns today for the evaluation and management of prostate cancer s/p fusion bx.   His status post 4 previous prostate biopsies all of which have been negative. He underwent a prostate MRI in 2019 which was also fairly unremarkable. Despite this, his PSA continues to rise steadily as outlined below.  He is managed on maximal medical therapy in the form of dutasteride and Flomax. His clinical symptoms are reasonably well controlled although he did continues to make bladder stones. In the past, he has declined surgical intervention but may be more open to this in the future.  His MRI from 12/09/2019 shows a 2.4 cm lesion at the extreme right anterior apex of the prostate (PI-RADS 5), additional 37mm lesion at right lateral apex of peripheral zone and thick-walled bladder with mucosal enhancement, possible reflecting inflammatory changes given multiple bladder calculi.   He has passed 3 stones during this time. Stones were not visible in most recent KUB in December but did have multiple small stone on CT in Feb 2020.   He underwent a fusion prostate bx on 01/25/20. His path report indicates 2 of 3 cores involved Gleason score 4+3 up to 60% of tissue primarily at left mid lateral left apex lateral and left mid. Prostate 43g.   PSA from 02/02/20 was 17.7.  This was closely following biopsy, unclear why this was drawn.  PSA History:  6.8 ng/mL on 03/02/2013-patient started on dutasteride  3.5 ng/mL on 07/20/2013 (7.0)  4.3 ng/mL on 06/07/2014 (8.6) 4.4 ng/mL on 12/27/2014 (8.8) 4.1 ng/mL on 06/30/2015 (8.2) 6.0 ng/mL on 03/13/2017(12.0) 7.3 ng/mL on 11/27/2017 (14.6) 8.4 ng/mL on 04/14/2018 (16.8) 11.6 ng/mL on 08/11/2019 (23.2)  12.08 ng/mL on 10/19/19 (24.16)    17.7 ng/mL on 02/02/20.   PMH: Past Medical History:  Diagnosis Date  . Acute appendicitis    perforated  . Anal fistula   . Chronic kidney disease   . Diverticulitis   . Hemorrhoids, internal   . History of kidney stones   . Hypertension   . Inguinal hernia     Surgical History: Past Surgical History:  Procedure Laterality Date  . APPENDECTOMY    . colonoscopy 2010    . HERNIA REPAIR  02/04/2012   Dr. Burt Knack  . INGUINAL HERNIA REPAIR  2013   Dr. Burt Knack  . INGUINAL HERNIA REPAIR Left 05/06/2019   Procedure: HERNIA REPAIR INGUINAL ADULT OPEN WITH MESH, LEFT;  Surgeon: Herbert Pun, MD;  Location: [facility] ORS;  Service: General;  Laterality: Left;  . LAPAROSCOPIC APPENDECTOMY  07/03/2011   Dr Burt Knack  . RECTAL SURGERY  08/08/2009   Dr. Pat Patrick    Home Medications:  Allergies as of 02/09/2020      Reactions   Clindamycin/lincomycin Rash   Tachycardia      Medication List       Accurate as of February 09, 2020 11:59 PM. If you have any questions, ask your nurse or doctor.        STOP taking these medications   tamsulosin 0.4 MG Caps capsule Commonly known as: FLOMAX Stopped by: Hollice Espy, MD     TAKE these medications   dutasteride 0.5 MG capsule Commonly known as: AVODART TAKE 1 CAPSULE BY MOUTH AT BEDTIME  hydrochlorothiazide 25 MG tablet Commonly known as: HYDRODIURIL Take 25 mg by mouth at bedtime.   lovastatin 40 MG tablet Commonly known as: MEVACOR Take 80 mg by mouth at bedtime.       Allergies:  Allergies  Allergen Reactions  . Clindamycin/Lincomycin Rash    Tachycardia     Family History: Family History  Problem Relation Age of Onset  . Hypertension Mother   . Cancer Father        prostate  . Hypertension Father   . Kidney disease Neg Hx   . Kidney cancer Neg Hx   . Bladder Cancer Neg Hx     Social History:  reports that he has never smoked. He has never used smokeless tobacco. He  reports current alcohol use. He reports that he does not use drugs.   Physical Exam: BP (!) 151/94   Pulse 94   Ht 5\' 8"  (1.727 m)   Wt 185 lb (83.9 kg)   BMI 28.13 kg/m   Constitutional:  Alert and oriented, No acute distress. HEENT: [place] AT, moist mucus membranes.  Trachea midline, no masses. Cardiovascular: No clubbing, cyanosis, or edema. Respiratory: Normal respiratory effort, no increased work of breathing. Skin: No rashes, bruises or suspicious lesions. Neurologic: Grossly intact, no focal deficits, moving all 4 extremities. Psychiatric: Normal mood and affect.  Assessment & Plan:    1. Prostate cancer  PSA from 02/02/20 was 17.7 (Not sure why blood work was done however expected elevation due to recent prostate bx)   Reviewed fusion bx path report indicating intermediate risk prostate cancer   The patient was counseled about the natural history of prostate cancer and the standard treatment options that are available for prostate cancer. It was explained to him how his age and life expectancy, clinical stage, Gleason score, and PSA affect his prognosis, the decision to proceed with additional staging studies, as well as how that information influences recommended treatment strategies. We discussed the roles for active surveillance, radiation therapy, surgical therapy, androgen deprivation, as well as ablative therapy options for the treatment of prostate cancer as appropriate to his individual cancer situation. We discussed the risks and benefits of these options with regard to their impact on cancer control and also in terms of potential adverse events, complications, and impact on quality of life particularly related to urinary, bowel, and sexual function. The patient was encouraged to ask questions throughout the discussion today and all questions were answered to his stated satisfaction. In addition, the patient was provided with and/or directed to appropriate resources and  literature for further education about prostate cancer treatment options.  Staged with MRI so no further staging necessary and no adenopathy however extracapsular tumor noted.   Recommended radiation with ADT in 6 months and will discuss with Dr. Baruch Gouty for further evaluation. Literature regarding Lupron injections given to patient.   Pt lives in [place], [place] so daily treatment possible barrier, will discuss further with Dr. Donella Stade regarding options  Ideally, we could treat his outlet obstruction and bladder stones prior to definitive treatment for his prostate cancer in order to optimize his symptoms and recurrent stones  2. BPH with obstruction/lower urinary tract symptoms  Encouraged bladder outlet procedure to address obstructed outcome.    Discussed Urolift procedure with risks and benefits as well as cystolitholapaxy. -These could be as fiducial markers for EBRT.  Alternatives including TURP/ HoLEP were also discussed.  He understands all risks including bleeding, infection, damage surrounding structures, need for further procedures,  possible need for Foley catheter amongst others. All questions answered. He was given literature on the procedure outlining the above as well.    Will discuss with Dr. Baruch Gouty for further evaluation and recommended sequence of events.    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 Hollice Espy,  I have reviewed the above documentation for accuracy and completeness, and I agree with the above.   Hollice Espy, MD  I spent 50 total minutes on the day of the encounter including pre-visit review of the medical record, face-to-face time with the patient, and post visit ordering of labs/imaging/tests.  Case was also subsequently discussed with Dr. Baruch Gouty later in the  day who agrees that your left initiation of ADT with cystolitholapaxy prior to EBRT would be ideal.  Will call patient to discuss further.

## 2020-02-12 ENCOUNTER — Telehealth: Payer: Self-pay | Admitting: Urology

## 2020-02-12 NOTE — Telephone Encounter (Signed)
Called patient to discuss recommendations after my discussion with Dr. Baruch Gouty regarding timing of prostate cancer treatment as well as outlet.  Left message on machine for patient to return phone call.  Hollice Espy, MD

## 2020-02-15 NOTE — Telephone Encounter (Signed)
Pt. Returning Dr.Brandon's call. Pt. States he is leaving home (10:15Am) and will not return today until after 5:00pm. He will be available for a return call tomorrow morning  02/16/20 before 10:30.

## 2020-02-22 ENCOUNTER — Other Ambulatory Visit: Payer: Self-pay

## 2020-02-22 ENCOUNTER — Encounter: Payer: Self-pay | Admitting: Radiation Oncology

## 2020-02-22 ENCOUNTER — Ambulatory Visit
Admission: RE | Admit: 2020-02-22 | Discharge: 2020-02-22 | Disposition: A | Discharge status: Home / Self Care | Payer: Medicare Other | Source: Ambulatory Visit | Attending: Radiation Oncology | Admitting: Radiation Oncology

## 2020-02-22 VITALS — BP 137/84 | HR 89 | Temp 98.0°F | Resp 16 | Wt 182.0 lb

## 2020-02-22 DIAGNOSIS — Z87442 Personal history of urinary calculi: Secondary | ICD-10-CM | POA: Insufficient documentation

## 2020-02-22 DIAGNOSIS — I129 Hypertensive chronic kidney disease with stage 1 through stage 4 chronic kidney disease, or unspecified chronic kidney disease: Secondary | ICD-10-CM | POA: Insufficient documentation

## 2020-02-22 DIAGNOSIS — N210 Calculus in bladder: Secondary | ICD-10-CM | POA: Diagnosis not present

## 2020-02-22 DIAGNOSIS — C61 Malignant neoplasm of prostate: Secondary | ICD-10-CM | POA: Insufficient documentation

## 2020-02-22 DIAGNOSIS — N189 Chronic kidney disease, unspecified: Secondary | ICD-10-CM | POA: Insufficient documentation

## 2020-02-22 DIAGNOSIS — Z79899 Other long term (current) drug therapy: Secondary | ICD-10-CM | POA: Insufficient documentation

## 2020-02-22 DIAGNOSIS — Z8719 Personal history of other diseases of the digestive system: Secondary | ICD-10-CM | POA: Insufficient documentation

## 2020-02-22 NOTE — Telephone Encounter (Signed)
Reached out to patient today, able to connect via telephone.  He was seen and evaluated by Dr. Baruch Gouty earlier today with whom the case was discussed again.  Strongly recommend treatment of bladder outlet with cystolitholapaxy prior to radiation.  We will plan for UroLift which will serve both as fiducial markers as well as relieve outlet obstruction.  Risk and benefits were reviewed again in detail with the patient.  Given that he has an hour drive, he would like to receive his Lupron injection at his preop visit.  He is agreeable this plan.  We will plan for radiation therapy, EBRT thereafter.  He may be interested in having this locally due to the commute.  Hollice Espy, MD

## 2020-02-22 NOTE — Consult Note (Signed)
NEW PATIENT EVALUATION  Name: [name]  MRN: [id number]  Date:   02/22/2020     DOB: 27-May-1941   This 79 y.o. male patient presents to the clinic for initial evaluation of stage IIa Gleason 7 (.  4+3) presenting with a PSA of 17  REFERRING PHYSICIAN: Marguerita Merles, MD  CHIEF COMPLAINT:  Chief Complaint  Patient presents with  . Prostate Cancer    DIAGNOSIS: The encounter diagnosis was Malignant neoplasm of prostate ([place]).   PREVIOUS INVESTIGATIONS:  MRI scan reviewed Pathology report reviewed Clinical notes reviewed  HPI: Patient is an 79 year old male who is been repeatedly biopsied in the past for suspicion of adenocarcinoma the prostate with rising PSA.  In February 2021 he had an MRI of his prostate showing a 2.4 cm lesion the right anterior apex a PI-RADS 5 lesion.  Patient also a history of bladder calculi.  Had a fusion prostate biopsy in March showing 3 cores positive for adenocarcinoma mostly Gleason 63+3) although one was 4+3.  He is been seen by urology and is scheduled for possible surgery for his bladder calculi as well as his BPH with obstruction and lower urinary tract symptoms.  Dr. Erlene Quan hasDiscussed Urolift procedure with risks and benefits as well as cystolitholapaxy. -He is seen today for radiation oncology opinion.  PLANNED TREATMENT REGIMEN: IMRT radiation therapy and possible hypofractionated regimen  PAST MEDICAL HISTORY:  has a past medical history of Acute appendicitis, Anal fistula, Chronic kidney disease, Diverticulitis, Hemorrhoids, internal, History of kidney stones, Hypertension, and Inguinal hernia.    PAST SURGICAL HISTORY:  Past Surgical History:  Procedure Laterality Date  . APPENDECTOMY    . colonoscopy 2010    . HERNIA REPAIR  02/04/2012   Dr. Burt Knack  . INGUINAL HERNIA REPAIR  2013   Dr. Burt Knack  . INGUINAL HERNIA REPAIR Left 05/06/2019   Procedure: HERNIA REPAIR INGUINAL ADULT OPEN WITH MESH, LEFT;  Surgeon: Herbert Pun,  MD;  Location: [facility] ORS;  Service: General;  Laterality: Left;  . LAPAROSCOPIC APPENDECTOMY  07/03/2011   Dr Burt Knack  . RECTAL SURGERY  08/08/2009   Dr. Pat Patrick    FAMILY HISTORY: family history includes Cancer in his father; Hypertension in his father and mother.  SOCIAL HISTORY:  reports that he has never smoked. He has never used smokeless tobacco. He reports current alcohol use. He reports that he does not use drugs.  ALLERGIES: Clindamycin/lincomycin  MEDICATIONS:  Current Outpatient Medications  Medication Sig Dispense Refill  . dutasteride (AVODART) 0.5 MG capsule TAKE 1 CAPSULE BY MOUTH AT BEDTIME 90 capsule 0  . hydrochlorothiazide (HYDRODIURIL) 25 MG tablet Take 25 mg by mouth at bedtime.   2  . lovastatin (MEVACOR) 40 MG tablet Take 80 mg by mouth at bedtime.      No current facility-administered medications for this encounter.    ECOG PERFORMANCE STATUS:  1 - Symptomatic but completely ambulatory  REVIEW OF SYSTEMS: Patient denies any weight loss, fatigue, weakness, fever, chills or night sweats. Patient denies any loss of vision, blurred vision. Patient denies any ringing  of the ears or hearing loss. No irregular heartbeat. Patient denies heart murmur or history of fainting. Patient denies any chest pain or pain radiating to her upper extremities. Patient denies any shortness of breath, difficulty breathing at night, cough or hemoptysis. Patient denies any swelling in the lower legs. Patient denies any nausea vomiting, vomiting of blood, or coffee ground material in the vomitus. Patient denies any stomach pain. Patient states  has had normal bowel movements no significant constipation or diarrhea. Patient denies any dysuria, hematuria or significant nocturia. Patient denies any problems walking, swelling in the joints or loss of balance. Patient denies any skin changes, loss of hair or loss of weight. Patient denies any excessive worrying or anxiety or significant depression.  Patient denies any problems with insomnia. Patient denies excessive thirst, polyuria, polydipsia. Patient denies any swollen glands, patient denies easy bruising or easy bleeding. Patient denies any recent infections, allergies or URI. Patient "s visual fields have not changed significantly in recent time.   PHYSICAL EXAM: BP 137/84 (BP Location: Left Arm, Patient Position: Sitting, Cuff Size: Normal)   Pulse 89   Temp 98 F (36.7 C) (Tympanic)   Resp 16   Wt 182 lb (82.6 kg)   BMI 27.67 kg/m  Well-developed well-nourished patient in NAD. HEENT reveals PERLA, EOMI, discs not visualized.  Oral cavity is clear. No oral mucosal lesions are identified. Neck is clear without evidence of cervical or supraclavicular adenopathy. Lungs are clear to A&P. Cardiac examination is essentially unremarkable with regular rate and rhythm without murmur rub or thrill. Abdomen is benign with no organomegaly or masses noted. Motor sensory and DTR levels are equal and symmetric in the upper and lower extremities. Cranial nerves II through XII are grossly intact. Proprioception is intact. No peripheral adenopathy or edema is identified. No motor or sensory levels are noted. Crude visual fields are within normal range.  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 Erlene Quan prior to any definitive radiation therapy.  Patient does live in [place] making daily trips to our department difficult.  I have explained to him that standard courses 8 weeks of external beam IMRT radiation therapy.  I can possibly shrink this down to 5 weeks with image guided radiation therapy.  He would have a slight increased chance of lower urinary  tract symptoms as well as diarrhea and fatigue.  Patient also has requested information about treatment up in [place] which is much closer to his home.  I will contact Dr. Erlene Quan and hopefully she will be able to go ahead with his surgery in the near future and I will reevaluate him after that.  I would like to take this opportunity to thank you for allowing me to participate in the care of your patient.Noreene Filbert, MD

## 2020-02-23 ENCOUNTER — Other Ambulatory Visit: Payer: Self-pay | Admitting: Radiology

## 2020-02-23 DIAGNOSIS — C61 Malignant neoplasm of prostate: Secondary | ICD-10-CM

## 2020-02-23 DIAGNOSIS — N210 Calculus in bladder: Secondary | ICD-10-CM

## 2020-02-23 DIAGNOSIS — N138 Other obstructive and reflux uropathy: Secondary | ICD-10-CM

## 2020-02-25 ENCOUNTER — Telehealth: Payer: Self-pay | Admitting: *Deleted

## 2020-02-25 ENCOUNTER — Encounter: Payer: Self-pay | Admitting: Urology

## 2020-02-25 ENCOUNTER — Encounter
Admission: RE | Admit: 2020-02-25 | Discharge: 2020-02-25 | Disposition: A | Discharge status: Home / Self Care | Payer: Medicare Other | Source: Ambulatory Visit | Attending: Urology | Admitting: Urology

## 2020-02-25 ENCOUNTER — Telehealth: Payer: Self-pay | Admitting: Urology

## 2020-02-25 ENCOUNTER — Other Ambulatory Visit: Payer: Self-pay

## 2020-02-25 HISTORY — DX: Calculus in bladder: N21.0

## 2020-02-25 NOTE — Telephone Encounter (Signed)
Patient called to verify that he understood education and plan for his future treatment. Patient verbalized understanding was able to verify date of next appointment.

## 2020-02-25 NOTE — Patient Instructions (Signed)
Your procedure is scheduled on: Mon. 5/3 Report to Day Surgery. To find out your arrival time please call [phone] between 1PM - 3PM on Friday .4/30  Remember: Instructions that are not followed completely may result in serious medical risk,  up to and including death, or upon the discretion of your surgeon and anesthesiologist your  surgery may need to be rescheduled.     _X__ 1. Do not eat food after midnight the night before your procedure.                 No gum chewing or hard candies. You may drink clear liquids up to 2 hours                 before you are scheduled to arrive for your surgery- DO not drink clear                 liquids within 2 hours of the start of your surgery.                 Clear Liquids include:  water, apple juice without pulp, clear Gatorade, G2 or                  Gatorade Zero (avoid Red/Purple/Blue), Black Coffee or Tea (Do not add                 anything to coffee or tea). _____2.   Complete the carbohydrate drink provided to you, 2 hours before arrival.  __X__2.  On the morning of surgery brush your teeth with toothpaste and water, you                may rinse your mouth with mouthwash if you wish.  Do not swallow any toothpaste of mouthwash.     _X__ 3.  No Alcohol for 24 hours before or after surgery.   ___ 4.  Do Not Smoke or use e-cigarettes For 24 Hours Prior to Your Surgery.                 Do not use any chewable tobacco products for at least 6 hours prior to                 Surgery.  _X__  5.  Do not use any recreational drugs (marijuana, cocaine, heroin, ecstacy, MDMA or other)                For at least one week prior to your surgery.  Combination of these drugs with anesthesia                May have life threatening results.  ____  6.  Bring all medications with you on the day of surgery if instructed.   __x__  7.  Notify your doctor if there is any change in your medical condition      (cold, fever, infections).     Do not  wear jewelry,  Do not wear lotions,  You may wear deodorant. Do not shave 48 hours prior to surgery. Men may shave face and neck. Do not bring valuables to the hospital.    [facility] is not responsible for any belongings or valuables.  Contacts, dentures or bridgework may not be worn into surgery. Leave your suitcase in the car. After surgery it may be brought to your room. For patients admitted to the hospital, discharge time is determined by your treatment team.   Patients discharged the day  of surgery will not be allowed to drive home.   Make arrangements for someone to be with you for the first 24 hours of your Same Day Discharge.    Please read over the following fact sheets that you were given:       __x__ Take these medicines the morning of surgery with A SIP OF WATER:    1. none  2.   3.   4.  5.  6.  ____ Fleet Enema (as directed)   ____ Use CHG Soap (or wipes) as directed  ____ Use Benzoyl Peroxide Gel as instructed  ____ Use inhalers on the day of surgery  ____ Stop metformin 2 days prior to surgery    ____ Take 1/2 of usual insulin dose the night before surgery. No insulin the morning          of surgery.   ____ Stop Coumadin/Plavix/aspirin   __x__ Stop Anti-inflammatories no ibuprofen aleve aspirin until after surgery     May take tylenol   ____ Stop supplements until after surgery.    ____ Bring C-Pap to the hospital.

## 2020-02-25 NOTE — Telephone Encounter (Signed)
NO PA FOR LUPRON WITH MCR/BCBS SUPPLEMENT   MICHELLE

## 2020-02-26 ENCOUNTER — Ambulatory Visit (INDEPENDENT_AMBULATORY_CARE_PROVIDER_SITE_OTHER): Payer: Medicare Other | Admitting: *Deleted

## 2020-02-26 ENCOUNTER — Other Ambulatory Visit: Payer: Self-pay

## 2020-02-26 ENCOUNTER — Encounter
Admission: RE | Admit: 2020-02-26 | Discharge: 2020-02-26 | Disposition: A | Discharge status: Home / Self Care | Payer: Medicare Other | Source: Ambulatory Visit | Attending: Urology | Admitting: Urology

## 2020-02-26 DIAGNOSIS — I10 Essential (primary) hypertension: Secondary | ICD-10-CM | POA: Insufficient documentation

## 2020-02-26 DIAGNOSIS — Z01818 Encounter for other preprocedural examination: Secondary | ICD-10-CM | POA: Diagnosis not present

## 2020-02-26 DIAGNOSIS — C61 Malignant neoplasm of prostate: Secondary | ICD-10-CM

## 2020-02-26 DIAGNOSIS — R9431 Abnormal electrocardiogram [ECG] [EKG]: Secondary | ICD-10-CM | POA: Diagnosis not present

## 2020-02-26 LAB — MICROSCOPIC EXAMINATION

## 2020-02-26 LAB — URINALYSIS, COMPLETE
Bilirubin, UA: NEGATIVE
Ketones, UA: NEGATIVE
Leukocytes,UA: NEGATIVE
Nitrite, UA: NEGATIVE
Specific Gravity, UA: 1.025 (ref 1.005–1.030)
Urobilinogen, Ur: 0.2 mg/dL (ref 0.2–1.0)
pH, UA: 5 (ref 5.0–7.5)

## 2020-02-26 LAB — BASIC METABOLIC PANEL
Anion gap: 8 (ref 5–15)
BUN: 27 mg/dL — ABNORMAL HIGH (ref 8–23)
CO2: 29 mmol/L (ref 22–32)
Calcium: 9.2 mg/dL (ref 8.9–10.3)
Chloride: 102 mmol/L (ref 98–111)
Creatinine, Ser: 1.31 mg/dL — ABNORMAL HIGH (ref 0.61–1.24)
GFR calc Af Amer: 59 mL/min — ABNORMAL LOW (ref >60)
GFR calc non Af Amer: 51 mL/min — ABNORMAL LOW (ref >60)
Glucose, Bld: 96 mg/dL (ref 70–99)
Potassium: 3.7 mmol/L (ref 3.5–5.1)
Sodium: 139 mmol/L (ref 135–145)

## 2020-02-26 LAB — CBC
HCT: 43.4 % (ref 39.0–52.0)
Hemoglobin: 15.6 g/dL (ref 13.0–17.0)
MCH: 29.4 pg (ref 26.0–34.0)
MCHC: 35.9 g/dL (ref 30.0–36.0)
MCV: 81.9 fL (ref 80.0–100.0)
Platelets: 266 10*3/uL (ref 150–400)
RBC: 5.3 MIL/uL (ref 4.22–5.81)
RDW: 12.5 % (ref 11.5–15.5)
WBC: 9.1 10*3/uL (ref 4.0–10.5)
nRBC: 0 % (ref 0.0–0.2)

## 2020-02-26 MED ORDER — LEUPROLIDE ACETATE (6 MONTH) 45 MG ~~LOC~~ KIT
45.0000 mg | PACK | Freq: Once | SUBCUTANEOUS | Status: AC
  Administered 2020-02-26: 45 mg via SUBCUTANEOUS

## 2020-02-26 NOTE — Progress Notes (Signed)
Eligard SubQ Injection   Due to Prostate Cancer patient is present today for a Eligard Injection.  Medication: Eligard 38month Dose: 4 mg  Location: right  [id number]  Exp: [id number]  Patient tolerated well, no complications were noted  Performed by: Gaspar Cola CMA     PA approval dates: not needed per note

## 2020-03-01 ENCOUNTER — Other Ambulatory Visit: Payer: Self-pay | Admitting: Radiology

## 2020-03-01 ENCOUNTER — Telehealth: Payer: Self-pay | Admitting: Radiology

## 2020-03-01 LAB — CULTURE, URINE COMPREHENSIVE

## 2020-03-01 NOTE — Telephone Encounter (Signed)
I just spoke with Mr. [name] back and answered all of his questions.  He is willing to proceed.  He was concerned about the risk of infection with your left but understands that the risk is relatively low and that he is at risk of infection as a stands currently with incomplete bladder emptying and these bladder stones.  He also understand that if he does not choose UroLift, then we need to place gold seed markers which also has an inherent risk infection.  After lengthy discussion, he is willing to proceed as previously scheduled.  All questions answered.  Hollice Espy, MD

## 2020-03-01 NOTE — Telephone Encounter (Signed)
Patient would like to postpone Urolift procedure and proceed with cystolitholapaxy.

## 2020-03-03 ENCOUNTER — Other Ambulatory Visit: Payer: Self-pay

## 2020-03-03 ENCOUNTER — Other Ambulatory Visit
Admission: RE | Admit: 2020-03-03 | Discharge: 2020-03-03 | Disposition: A | Discharge status: Home / Self Care | Payer: Medicare Other | Source: Ambulatory Visit | Attending: Urology | Admitting: Urology

## 2020-03-03 DIAGNOSIS — Z20822 Contact with and (suspected) exposure to covid-19: Secondary | ICD-10-CM | POA: Insufficient documentation

## 2020-03-03 DIAGNOSIS — Z01812 Encounter for preprocedural laboratory examination: Secondary | ICD-10-CM | POA: Insufficient documentation

## 2020-03-03 LAB — SARS CORONAVIRUS 2 (TAT 6-24 HRS): SARS Coronavirus 2: NEGATIVE

## 2020-03-07 ENCOUNTER — Encounter
Admission: RE | Disposition: A | Discharge status: Home / Self Care | Payer: Self-pay | Source: Ambulatory Visit | Attending: Urology

## 2020-03-07 ENCOUNTER — Encounter: Payer: Self-pay | Admitting: Urology

## 2020-03-07 ENCOUNTER — Ambulatory Visit: Payer: Medicare Other | Admitting: Anesthesiology

## 2020-03-07 ENCOUNTER — Ambulatory Visit
Admission: RE | Admit: 2020-03-07 | Discharge: 2020-03-07 | Disposition: A | Discharge status: Home / Self Care | Payer: Medicare Other | Source: Ambulatory Visit | Attending: Urology | Admitting: Urology

## 2020-03-07 ENCOUNTER — Other Ambulatory Visit: Payer: Self-pay

## 2020-03-07 DIAGNOSIS — N210 Calculus in bladder: Secondary | ICD-10-CM | POA: Diagnosis not present

## 2020-03-07 DIAGNOSIS — C61 Malignant neoplasm of prostate: Secondary | ICD-10-CM | POA: Diagnosis not present

## 2020-03-07 DIAGNOSIS — N189 Chronic kidney disease, unspecified: Secondary | ICD-10-CM | POA: Insufficient documentation

## 2020-03-07 DIAGNOSIS — Z79899 Other long term (current) drug therapy: Secondary | ICD-10-CM | POA: Insufficient documentation

## 2020-03-07 DIAGNOSIS — I129 Hypertensive chronic kidney disease with stage 1 through stage 4 chronic kidney disease, or unspecified chronic kidney disease: Secondary | ICD-10-CM | POA: Insufficient documentation

## 2020-03-07 DIAGNOSIS — Z881 Allergy status to other antibiotic agents status: Secondary | ICD-10-CM | POA: Insufficient documentation

## 2020-03-07 DIAGNOSIS — N138 Other obstructive and reflux uropathy: Secondary | ICD-10-CM | POA: Insufficient documentation

## 2020-03-07 DIAGNOSIS — N401 Enlarged prostate with lower urinary tract symptoms: Secondary | ICD-10-CM | POA: Diagnosis not present

## 2020-03-07 HISTORY — PX: CYSTOSCOPY WITH LITHOLAPAXY: SHX1425

## 2020-03-07 HISTORY — PX: CYSTOSCOPY WITH INSERTION OF UROLIFT: SHX6678

## 2020-03-07 SURGERY — CYSTOSCOPY, WITH BLADDER CALCULUS LITHOLAPAXY
Anesthesia: General | Site: Prostate

## 2020-03-07 MED ORDER — FAMOTIDINE 20 MG PO TABS: 20.0000 mg | ORAL_TABLET | Freq: Once | ORAL | Status: AC

## 2020-03-07 MED ORDER — FENTANYL CITRATE (PF) 100 MCG/2ML IJ SOLN
INTRAMUSCULAR | Status: DC | PRN
  Administered 2020-03-07: 25 ug via INTRAVENOUS

## 2020-03-07 MED ORDER — LACTATED RINGERS IV SOLN: INTRAVENOUS | Status: DC | PRN

## 2020-03-07 MED ORDER — ONDANSETRON HCL 4 MG/2ML IJ SOLN
INTRAMUSCULAR | Status: AC
  Filled 2020-03-07: qty 6

## 2020-03-07 MED ORDER — MIDAZOLAM HCL 2 MG/2ML IJ SOLN
INTRAMUSCULAR | Status: AC
  Filled 2020-03-07: qty 2

## 2020-03-07 MED ORDER — FAMOTIDINE 20 MG PO TABS
ORAL_TABLET | ORAL | Status: AC
  Administered 2020-03-07: 20 mg via ORAL
  Filled 2020-03-07: qty 1

## 2020-03-07 MED ORDER — PHENYLEPHRINE HCL (PRESSORS) 10 MG/ML IV SOLN
INTRAVENOUS | Status: DC | PRN
  Administered 2020-03-07 (×2): 100 ug via INTRAVENOUS

## 2020-03-07 MED ORDER — FENTANYL CITRATE (PF) 100 MCG/2ML IJ SOLN
INTRAMUSCULAR | Status: AC
  Filled 2020-03-07: qty 2

## 2020-03-07 MED ORDER — HYDRALAZINE HCL 20 MG/ML IJ SOLN
10.0000 mg | Freq: Once | INTRAMUSCULAR | Status: AC
  Administered 2020-03-07: 10 mg via INTRAVENOUS

## 2020-03-07 MED ORDER — HYDRALAZINE HCL 20 MG/ML IJ SOLN
INTRAMUSCULAR | Status: AC
  Filled 2020-03-07: qty 1

## 2020-03-07 MED ORDER — GLYCOPYRROLATE 0.2 MG/ML IJ SOLN
INTRAMUSCULAR | Status: DC | PRN
  Administered 2020-03-07 (×2): .2 mg via INTRAVENOUS

## 2020-03-07 MED ORDER — ONDANSETRON HCL 4 MG/2ML IJ SOLN: 4.0000 mg | Freq: Once | INTRAMUSCULAR | Status: DC | PRN

## 2020-03-07 MED ORDER — DEXAMETHASONE SODIUM PHOSPHATE 10 MG/ML IJ SOLN
INTRAMUSCULAR | Status: AC
  Filled 2020-03-07: qty 2

## 2020-03-07 MED ORDER — GLYCOPYRROLATE 0.2 MG/ML IJ SOLN
INTRAMUSCULAR | Status: AC
  Filled 2020-03-07: qty 2

## 2020-03-07 MED ORDER — CEFAZOLIN SODIUM-DEXTROSE 2-4 GM/100ML-% IV SOLN
INTRAVENOUS | Status: AC
  Filled 2020-03-07: qty 100

## 2020-03-07 MED ORDER — LACTATED RINGERS IV SOLN: INTRAVENOUS | Status: DC

## 2020-03-07 MED ORDER — DEXAMETHASONE SODIUM PHOSPHATE 10 MG/ML IJ SOLN
INTRAMUSCULAR | Status: DC | PRN
  Administered 2020-03-07: 10 mg via INTRAVENOUS

## 2020-03-07 MED ORDER — CEFAZOLIN SODIUM-DEXTROSE 2-4 GM/100ML-% IV SOLN
2.0000 g | INTRAVENOUS | Status: AC
  Administered 2020-03-07: 2 g via INTRAVENOUS

## 2020-03-07 MED ORDER — FENTANYL CITRATE (PF) 100 MCG/2ML IJ SOLN: 25.0000 ug | INTRAMUSCULAR | Status: DC | PRN

## 2020-03-07 MED ORDER — HYDROCODONE-ACETAMINOPHEN 5-325 MG PO TABS: 1.0000 | ORAL_TABLET | Freq: Four times a day (QID) | ORAL | 0 refills | Status: DC | PRN

## 2020-03-07 MED ORDER — ONDANSETRON HCL 4 MG/2ML IJ SOLN
INTRAMUSCULAR | Status: DC | PRN
  Administered 2020-03-07: 4 mg via INTRAVENOUS

## 2020-03-07 MED ORDER — PROPOFOL 10 MG/ML IV BOLUS
INTRAVENOUS | Status: DC | PRN
  Administered 2020-03-07: 130 mg via INTRAVENOUS

## 2020-03-07 MED ORDER — LIDOCAINE HCL (CARDIAC) PF 100 MG/5ML IV SOSY
PREFILLED_SYRINGE | INTRAVENOUS | Status: DC | PRN
  Administered 2020-03-07: 60 mg via INTRAVENOUS

## 2020-03-07 SURGICAL SUPPLY — 17 items
BAG DRAIN CYSTO-URO LG1000N (MISCELLANEOUS) ×4 IMPLANT
BASKET ZERO TIP 1.9FR (BASKET) ×4 IMPLANT
COVER WAND RF STERILE (DRAPES) ×4 IMPLANT
FIBER LASER FLEXIVA 365 (UROLOGICAL SUPPLIES) ×2 IMPLANT
GLOVE BIO SURGEON STRL SZ 6.5 (GLOVE) ×3 IMPLANT
GLOVE BIO SURGEONS STRL SZ 6.5 (GLOVE) ×1
GOWN STRL REUS W/ TWL LRG LVL3 (GOWN DISPOSABLE) ×4 IMPLANT
GOWN STRL REUS W/TWL LRG LVL3 (GOWN DISPOSABLE) ×4
KIT TURNOVER CYSTO (KITS) ×4 IMPLANT
PACK CYSTO AR (MISCELLANEOUS) ×4 IMPLANT
SET CYSTO W/LG BORE CLAMP LF (SET/KITS/TRAYS/PACK) ×4 IMPLANT
SET IRRIG Y TYPE TUR BLADDER L (SET/KITS/TRAYS/PACK) ×4 IMPLANT
SURGILUBE 2OZ TUBE FLIPTOP (MISCELLANEOUS) ×4 IMPLANT
SYRINGE IRR TOOMEY STRL 70CC (SYRINGE) ×4 IMPLANT
SYSTEM UROLIFT (Male Continence) ×12 IMPLANT
WATER STERILE IRR 1000ML POUR (IV SOLUTION) ×4 IMPLANT
WATER STERILE IRR 3000ML UROMA (IV SOLUTION) ×4 IMPLANT

## 2020-03-07 NOTE — Anesthesia Preprocedure Evaluation (Signed)
Anesthesia Evaluation  Patient identified by MRN, date of birth, ID band Patient awake    Reviewed: Allergy & Precautions, H&P , NPO status , Patient's Chart, lab work & pertinent test results, reviewed documented beta blocker date and time   Airway Mallampati: II  TM Distance: >3 FB Neck ROM: full    Dental  (+) Teeth Intact   Pulmonary neg pulmonary ROS,    Pulmonary exam normal        Cardiovascular Exercise Tolerance: Poor hypertension, On Medications negative cardio ROS Normal cardiovascular exam Rate:Normal     Neuro/Psych negative neurological ROS  negative psych ROS   GI/Hepatic negative GI ROS, Neg liver ROS,   Endo/Other  negative endocrine ROS  Renal/GU negative Renal ROS  negative genitourinary   Musculoskeletal   Abdominal   Peds  Hematology negative hematology ROS (+)   Anesthesia Other Findings   Reproductive/Obstetrics negative OB ROS                             Anesthesia Physical Anesthesia Plan  ASA: II  Anesthesia Plan: General LMA   Post-op Pain Management:    Induction:   PONV Risk Score and Plan:   Airway Management Planned:   Additional Equipment:   Intra-op Plan:   Post-operative Plan:   Informed Consent: I have reviewed the patients History and Physical, chart, labs and discussed the procedure including the risks, benefits and alternatives for the proposed anesthesia with the patient or authorized representative who has indicated his/her understanding and acceptance.       Plan Discussed with: CRNA  Anesthesia Plan Comments:         Anesthesia Quick Evaluation

## 2020-03-07 NOTE — Progress Notes (Signed)
Patient tried many times to urinate on his own and I bladder scanned him which he had 154 cc of urine in his bladder but could not therefore I secure chatted Dr. Erlene Quan and she suggested to insert a 18 coude catheter if patient could not urinate. The patient was unable to urinate so [name] (OR nurse) inserted the catheter with ease but no urine came out. I messaged Dr. Erlene Quan again and she stated to irrigate 500 cc and draw it back out to remove and separate any clots. I irrigated the line and bladder scanned the patient with only 8 cc in the bladder. Patient is stable and ready to go home. We will teach the patient and his daughter in law how to empty the catheter and to let them know to call the office in the morning 03/08/20 for directions on foley removal per Dr. Erlene Quan. I told him and his daughter in law if the foley stop flowing to go to the emergency room and they stated they understood.

## 2020-03-07 NOTE — Anesthesia Procedure Notes (Signed)
Procedure Name: LMA Insertion Performed by: Kelton Pillar, CRNA Pre-anesthesia Checklist: Patient identified, Emergency Drugs available, Suction available and Patient being monitored Patient Re-evaluated:Patient Re-evaluated prior to induction Oxygen Delivery Method: Circle system utilized Preoxygenation: Pre-oxygenation with 100% oxygen Induction Type: IV induction LMA: LMA inserted LMA Size: 4.0 Number of attempts: 1 Placement Confirmation: positive ETCO2,  CO2 detector and breath sounds checked- equal and bilateral Tube secured with: Tape Dental Injury: Teeth and Oropharynx as per pre-operative assessment

## 2020-03-07 NOTE — Op Note (Signed)
Preoperative diagnosis: BPH with obstructive symptomatology, bladder stones, prostate cancer   Postoperative diagnosis: BPH with obstructive symptomatology, bladder stones, prostate cancer   Principal procedure: Urolift procedure, with the placement of 6 implants, cystolitholapaxy   Surgeon: Hollice Espy   Anesthesia: LMA   Complications: None   Drains: None   Estimated blood loss: < 5 mL   Indications: 79 year-old male with obstructive symptomatology secondary to BPH, prostate cancer, as well as bladder stones. Management options including TURP with resection/ablation of the prostate as well as Urolift were discussed.  The patient has chosen to have a Urolift procedure which will also serve as fiducial markers for his radiation. He has been instructed to the procedure as well as risks and complications which include but are not limited to infection, bleeding, and inadequate treatment with the Urolift procedure alone, anesthetic complications, among others.  He understands these and desires to proceed.   Findings: Using the 17 French cystoscope, urethra and bladder were inspected.  There were no urethral lesions.  Prostatic urethra was obstructed secondary to bilobar hypertrophy.  The bladder was inspected circumferentially.    At least 8 bladder stones measuring approximately 1 cm each were located within the dependent portion of a trabeculated bladder.   Description of procedure: The patient was properly identified in the holding area.  He received preoperative IV antibiotics.  He was taken to the operating room where general anesthetic was administered with the LMA.  He is placed in the dorsolithotomy position.  Genitalia and perineum were prepped and draped.  Proper timeout was performed.  A 26 French resectoscope using a visual obturator was advanced per urethra into the bladder.  This point time, multiple very spherical stones were identified, at least 8-10 of them measuring up to 1 cm  each.  These were not amenable to irrigation due to the large size.  A 365 m laser fiber was brought in using settings initially of 2 and 30 Hz, the stones were fragmented into smaller pieces.  These were then irrigated.  There are few more large pieces requiring further fragmentation and due to the smaller size of the stone, I did reduce the energy delivered to the stone to avoid trauma to the bladder.  Eventually, I was able to evacuate all stones and stone debris from the bladder.  There is a small area of bleeding in the posterior wall, likely secondary to laser fiber trauma.  This appeared superficial.  Bugbee was then brought in and this area was fulgurated for hemostasis.  The UOs were inspected and were normal and free of any injury.   A 72F cystoscope was inserted into the bladder with findings as described above.  The 1st pair of implants were placed at the bladder neck ~1.5 cm from the bladder Neck. The 2nd pair of implants were placed at the level of the verumontanum.   A repeat cysto was performed and another pair of implants in between the prior pairs.    A final cystoscopy was conducted first to inspect the location and state of each implant and second, to confirm the presence of a continuous anterior channel was present through the prostatic urethra with irrigation flow turned off.    Six implants were delivered in total.    Following this, the scope was removed.  After anesthetic reversal he was transported to the PACU in stable condition.  He tolerated the procedure well.   Plan: He will follow-up with me in 4 to 6 weeks for  IPSS/PVR.

## 2020-03-07 NOTE — Progress Notes (Signed)
Patient's discharge blood pressure is 174/88 and his preop pressure was 162/89 so Dr. Amie Critchley stated he was okay with the patient leaving with that blood pressure.

## 2020-03-07 NOTE — Transfer of Care (Signed)
Immediate Anesthesia Transfer of Care Note  Patient: [name]  Procedure(s) Performed: CYSTOSCOPY WITH LITHOLAPAXY (N/A Bladder) CYSTOSCOPY WITH INSERTION OF UROLIFT (N/A Prostate)  Patient Location: PACU  Anesthesia Type:General  Level of Consciousness: drowsy  Airway & Oxygen Therapy: Patient Spontanous Breathing and Patient connected to face mask oxygen  Post-op Assessment: Report given to RN and Post -op Vital signs reviewed and stable  Post vital signs: Reviewed and stable  Last Vitals:  Vitals Value Taken Time  BP 173/99 03/07/20 1454  Temp    Pulse 75 03/07/20 1456  Resp 15 03/07/20 1456  SpO2 99 % 03/07/20 1456  Vitals shown include unvalidated device data.  Last Pain:  Vitals:   03/07/20 1454  TempSrc:   PainSc: (P) 0-No pain      Patients Stated Pain Goal: 0 ([id number])  Complications: No apparent anesthesia complications

## 2020-03-07 NOTE — Interval H&P Note (Signed)
History and Physical Interval Note:  03/07/2020 1:12 PM  [name]  has presented today for surgery, with the diagnosis of bladder stones, prostate cancer.  The various methods of treatment have been discussed with the patient and family. After consideration of risks, benefits and other options for treatment, the patient has consented to  Procedure(s): CYSTOSCOPY WITH LITHOLAPAXY (N/A) CYSTOSCOPY WITH INSERTION OF UROLIFT (N/A) as a surgical intervention.  The patient's history has been reviewed, patient examined, no change in status, stable for surgery.  I have reviewed the patient's chart and labs.  Questions were answered to the patient's satisfaction.    RRR CTAB   Hollice Espy

## 2020-03-07 NOTE — Discharge Instructions (Signed)
AMBULATORY SURGERY  DISCHARGE INSTRUCTIONS   1) The drugs that you were given will stay in your system until tomorrow so for the next 24 hours you should not:  A) Drive an automobile B) Make any legal decisions C) Drink any alcoholic beverage   2) You may resume regular meals tomorrow.  Today it is better to start with liquids and gradually work up to solid foods.  You may eat anything you prefer, but it is better to start with liquids, then soup and crackers, and gradually work up to solid foods.   3) Please notify your doctor immediately if you have any unusual bleeding, trouble breathing, redness and pain at the surgery site, drainage, fever, or pain not relieved by medication.    4) Additional Instructions:        Please contact your physician with any problems or Same Day Surgery at [phone], Monday through Friday 6 am to 4 pm, or [place] [facility] number at [phone].Urolift Post-Operative Instructions     Patient Expectations   1. Mild blood in your urine for about 1 week.  2. Urinary buring, frequency, and urgency for 10 days.  3. Mild pelvic pain 1-2 weeks.     Return to Activity     1. Drink water post procedure.  2. Take meds as needed.  Tylenol and/or Motrin is most helpful.  You may also by Pyridium/Azo over-the-counter for urinary burning.  3. No lifting or straining 48hrs.  4. Other activity when they feel up to it.  

## 2020-03-08 NOTE — Anesthesia Postprocedure Evaluation (Signed)
Anesthesia Post Note  Patient: Daymian Plemmons  Procedure(s) Performed: CYSTOSCOPY WITH LITHOLAPAXY (N/A Bladder) CYSTOSCOPY WITH INSERTION OF UROLIFT (N/A Prostate)  Patient location during evaluation: PACU Anesthesia Type: General Level of consciousness: awake and alert Pain management: pain level controlled Vital Signs Assessment: post-procedure vital signs reviewed and stable Respiratory status: spontaneous breathing, nonlabored ventilation and respiratory function stable Cardiovascular status: blood pressure returned to baseline and stable Postop Assessment: no apparent nausea or vomiting Anesthetic complications: no     Last Vitals:  Vitals:   03/07/20 1725 03/07/20 1922  BP: (!) 164/83 (!) 174/88  Pulse: 86 (!) 104  Resp:  18  Temp:  36.7 C  SpO2: 99% 99%    Last Pain:  Vitals:   03/07/20 1922  TempSrc: Temporal  PainSc: 0-No pain                 Brett Canales Artia Singley

## 2020-03-09 ENCOUNTER — Ambulatory Visit (INDEPENDENT_AMBULATORY_CARE_PROVIDER_SITE_OTHER): Payer: Medicare Other | Admitting: Urology

## 2020-03-09 ENCOUNTER — Other Ambulatory Visit: Payer: Self-pay

## 2020-03-09 ENCOUNTER — Ambulatory Visit: Payer: Medicare Other | Admitting: Urology

## 2020-03-09 DIAGNOSIS — N138 Other obstructive and reflux uropathy: Secondary | ICD-10-CM | POA: Diagnosis not present

## 2020-03-09 DIAGNOSIS — N401 Enlarged prostate with lower urinary tract symptoms: Secondary | ICD-10-CM | POA: Diagnosis not present

## 2020-03-09 LAB — BLADDER SCAN AMB NON-IMAGING: Scan Result: 86

## 2020-03-09 NOTE — Progress Notes (Signed)
Fill and Pull Catheter Removal  Patient is present today for a catheter removal.  Patient was cleaned and prepped in a sterile fashion 21ml of sterile water/ saline was instilled into the bladder when the patient felt the urge to urinate. 67ml of water was then drained from the balloon.  A 18 FR foley cath was removed from the bladder complications were noted as: patient experienced a bladder spasm during instillation and some water that was instilled came our around catheter, patient was not in pain and tolerated well .  Patient as then given some time to void on their own.  Patient can void  33ml on their own after some time.  Patient tolerated well.  Performed by: Fonnie Jarvis, CMA  Follow up/ Additional notes: Patient was encouraged to drink plenty of water and return this afternoon for a bladder scan  Follow up PVR 86 mL.

## 2020-03-28 ENCOUNTER — Ambulatory Visit
Admission: RE | Admit: 2020-03-28 | Discharge: 2020-03-28 | Disposition: A | Discharge status: Home / Self Care | Payer: Medicare Other | Source: Ambulatory Visit | Attending: Radiation Oncology | Admitting: Radiation Oncology

## 2020-03-28 ENCOUNTER — Other Ambulatory Visit: Payer: Self-pay

## 2020-03-28 ENCOUNTER — Other Ambulatory Visit: Payer: Self-pay | Admitting: *Deleted

## 2020-03-28 VITALS — BP 155/87 | HR 83 | Temp 97.8°F | Wt 182.0 lb

## 2020-03-28 DIAGNOSIS — C61 Malignant neoplasm of prostate: Secondary | ICD-10-CM

## 2020-03-28 NOTE — Progress Notes (Signed)
Radiation Oncology Follow up Note  Name: [name]   Date:   03/28/2020 MRN:  [id number] DOB: [birth year]    This 79 y.o. 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  He is done well is markedly improved his urinary function and symptoms.  Patient lives in [place] and has requested for his radiation to be done in [place] which is only 1520 minutes from his home.  COMPLICATIONS OF TREATMENT: none  FOLLOW UP COMPLIANCE: keeps appointments   PHYSICAL EXAM:  BP (!) 155/87 (BP Location: Left Arm, Patient Position: Sitting, Cuff Size: Normal)   Pulse 83   Temp 97.8 F (36.6 C) (Tympanic)   Wt 182 lb (82.6 kg)   BMI 27.67 kg/m  Well-developed well-nourished patient in NAD. HEENT reveals PERLA, EOMI, discs not visualized.  Oral cavity is clear. No oral mucosal lesions are identified. Neck is clear without evidence of cervical or supraclavicular adenopathy. Lungs are clear to A&P. Cardiac examination is essentially unremarkable with regular rate and rhythm without murmur rub or thrill. Abdomen is benign with no organomegaly or masses noted. Motor sensory and DTR levels are equal and symmetric in the upper and lower extremities. Cranial nerves II through XII are grossly intact. Proprioception is intact. No peripheral adenopathy or edema is identified. No motor or sensory levels are noted. Crude visual fields are within normal range.  RADIOLOGY RESULTS: No current films to review  PLAN: At this time we will make a an appointment to be seen in [place] by  [place] which I believe is at [facility].  Referral was made.  I would like to take this opportunity to thank you for allowing me to participate in the care of your patient.Noreene Filbert, MD

## 2020-04-05 NOTE — Progress Notes (Signed)
04/06/20 2:52 PM   [name] December 12, 1940 [id number]  Referring provider: Marguerita Merles, [place],  [place] [address] Chief Complaint  Patient presents with   Prostate Cancer    HPI: [name] is a 79 y.o. M who returns today for 6 week f/u post op s/p urolift, cystolithalopaxy.  Hisstatus post 4 previous prostate biopsies all of which have been negative. He underwent a prostate MRI in 2019 which was also fairly unremarkable. Despite this, his PSA continues to rise steadily as outlined below.  His MRI from 12/09/2019 shows a 2.4 cm lesion at the extreme right anterior apex of the prostate (PI-RADS 5), additional 47mm lesion at right lateral apex of peripheral zone and thick-walled bladder with mucosal enhancement, possible reflecting inflammatory changes given multiple bladder calculi.  He has passed 3 stones during this time. Stones were not visible in most recent KUB in December but did have multiple smallstone on CTin Feb 2020.   He underwent a fusion prostate bx on 01/25/20. His path report indicates 2 of 3 cores involved Gleason score 4+3 up to 60% of tissue primarily at left mid lateral left apex lateral and left mid. Prostate 43g.   PSA from 02/02/20 was 17.7.  This was closely following biopsy, unclear why this was drawn.  He underwent cystoscopy w/ litholapaxy and insertion of Urolift on 03/27/20 for the purpose of alleviating his urinary symptoms as well as providing fiducial markers for the purpose of XRT.  He is already received ADT x 6 months in 02/2020.  He reports of voiding well and witnessed significant improvement in urinary flow and bladder emptying.  He is extremely pleased with the result.  He feels like his flow is better and that he is emptying better.  He is really glad he had the procedure done.  IPSS as below.    He prefers to undergo radiation closer to home in [place], [place].  PSA History:  6.8 ng/mL on 03/02/2013-patient  started on dutasteride  3.5 ng/mL on 07/20/2013 (7.0)  4.3 ng/mL on 06/07/2014 (8.6) 4.4 ng/mL on 12/27/2014 (8.8) 4.1 ng/mL on 06/30/2015 (8.2) 6.0 ng/mL on 03/13/2017(12.0) 7.3 ng/mL on 11/27/2017 (14.6) 8.4 ng/mL on 04/14/2018 (16.8) 11.6 ng/mL on 08/11/2019 (23.2) 12.08 ng/mL on 10/19/19 (24.16)    17.7 ng/mL on 02/02/20  IPSS    Row Name 04/06/20 1100         International Prostate Symptom Score   How often have you had the sensation of not emptying your bladder?  Not at All     How often have you had to urinate less than every two hours?  Less than 1 in 5 times     How often have you found you stopped and started again several times when you urinated?  Not at All     How often have you found it difficult to postpone urination?  Less than half the time     How often have you had a weak urinary stream?  Not at All     How often have you had to strain to start urination?  Not at All     How many times did you typically get up at night to urinate?  2 Times     Total IPSS Score  5       Quality of Life due to urinary symptoms   If you were to spend the rest of your life with your urinary condition just the way it is  now how would you feel about that?  Mostly Satisfied        Score:  1-7 Mild 8-19 Moderate 20-35 Severe  PMH: Past Medical History:  Diagnosis Date   Acute appendicitis    perforated   Anal fistula    Bladder stones    Diverticulitis    Hemorrhoids, internal    Hypertension    Inguinal hernia     Surgical History: Past Surgical History:  Procedure Laterality Date   APPENDECTOMY     colonoscopy 2010     CYSTOSCOPY WITH INSERTION OF UROLIFT N/A 03/07/2020   Procedure: CYSTOSCOPY WITH INSERTION OF UROLIFT;  Surgeon: Hollice Espy, MD;  Location: [facility];  Service: Urology;  Laterality: N/A;   CYSTOSCOPY WITH LITHOLAPAXY N/A 03/07/2020   Procedure: CYSTOSCOPY WITH LITHOLAPAXY;  Surgeon: Hollice Espy, MD;   Location: [facility];  Service: Urology;  Laterality: N/A;   HERNIA REPAIR  02/04/2012   Dr. Jason Nest HERNIA REPAIR Left 05/06/2019   Procedure: HERNIA REPAIR INGUINAL ADULT OPEN WITH MESH, LEFT;  Surgeon: Herbert Pun, MD;  Location: [facility];  Service: General;  Laterality: Left;   INGUINAL HERNIA REPAIR  2013   Dr. Burt Knack   LAPAROSCOPIC APPENDECTOMY  07/03/2011   Dr Burt Knack   RECTAL SURGERY  08/08/2009   Dr. Pat Patrick    Home Medications:  Allergies as of 04/06/2020      Reactions   Clindamycin/lincomycin Rash   Tachycardia      Medication List       Accurate as of April 06, 2020 11:59 PM. If you have any questions, ask your nurse or doctor.        dutasteride 0.5 MG capsule Commonly known as: AVODART TAKE 1 CAPSULE BY MOUTH AT BEDTIME   hydrochlorothiazide 25 MG tablet Commonly known as: HYDRODIURIL Take 25 mg by mouth at bedtime.   HYDROcodone-acetaminophen 5-325 MG tablet Commonly known as: NORCO/VICODIN Take 1-2 tablets by mouth every 6 (six) hours as needed for moderate pain.   lovastatin 40 MG tablet Commonly known as: MEVACOR Take 80 mg by mouth at bedtime.       Allergies:  Allergies  Allergen Reactions   Clindamycin/Lincomycin Rash    Tachycardia     Family History: Family History  Problem Relation Age of Onset   Hypertension Mother    Cancer Father        prostate   Hypertension Father    Kidney disease Neg Hx    Kidney cancer Neg Hx    Bladder Cancer Neg Hx     Social History:  reports that he has never smoked. He has never used smokeless tobacco. He reports current alcohol use. He reports that he does not use drugs.   Physical Exam: BP (!) 152/88    Pulse 84   Constitutional:  Alert and oriented, No acute distress. HEENT: [place] AT, moist mucus membranes.  Trachea midline, no masses. Cardiovascular: No clubbing, cyanosis, or edema. Respiratory: Normal respiratory effort, no increased work of breathing. Skin: No  rashes, bruises or suspicious lesions. Neurologic: Grossly intact, no focal deficits, moving all 4 extremities. Psychiatric: Normal mood and affect.  Laboratory Data:  Lab Results  Component Value Date   CREATININE 1.31 (H) 02/26/2020   Pertinent Imaging: Results for orders placed or performed in visit on 04/06/20  BLADDER SCAN AMB NON-IMAGING  Result Value Ref Range   Scan Result 40 ML     Assessment & Plan:    1. Prostate cancer Received  6 month [place] in April  Pt lives in [place], [place] and will undergo radiation treatment closer to home  Fu/ 6 months with PSA   2. BPH with obstruction/lower urinary tract symptoms  S/p Urolift w/ cystolitholapaxy  Significant improvement in flow and bladder emptying  Adequate emptying of bladder  Currently on dutasteride as well as ADT which is slightly redundant however given his history of severe symptoms and upcoming ADT, will continue for time being Consider stopping if symptoms remain stable after radiation  3. Bladder stones Resolved, as above   6 mo with [facility] [address], [place], [place] [address] [phone]  I, Lucas Mallow, am acting as a scribe for Dr. Hollice Espy,  I have reviewed the above documentation for accuracy and completeness, and I agree with the above.   Hollice Espy, MD

## 2020-04-06 ENCOUNTER — Encounter: Payer: Self-pay | Admitting: Urology

## 2020-04-06 ENCOUNTER — Ambulatory Visit (INDEPENDENT_AMBULATORY_CARE_PROVIDER_SITE_OTHER): Payer: Medicare Other | Admitting: Urology

## 2020-04-06 ENCOUNTER — Other Ambulatory Visit: Payer: Self-pay

## 2020-04-06 VITALS — BP 152/88 | HR 84

## 2020-04-06 DIAGNOSIS — C61 Malignant neoplasm of prostate: Secondary | ICD-10-CM | POA: Diagnosis not present

## 2020-04-06 LAB — BLADDER SCAN AMB NON-IMAGING: Scan Result: 40

## 2020-05-11 ENCOUNTER — Other Ambulatory Visit: Payer: Self-pay | Admitting: Urology

## 2020-08-06 ENCOUNTER — Other Ambulatory Visit: Payer: Self-pay | Admitting: Urology

## 2020-10-06 ENCOUNTER — Other Ambulatory Visit: Payer: Self-pay

## 2020-10-10 ENCOUNTER — Other Ambulatory Visit: Payer: Medicare Other

## 2020-10-10 ENCOUNTER — Other Ambulatory Visit: Payer: Self-pay

## 2020-10-10 DIAGNOSIS — C61 Malignant neoplasm of prostate: Secondary | ICD-10-CM

## 2020-10-11 LAB — PSA: Prostate Specific Ag, Serum: <0.1 ng/mL (ref 0.0–4.0)

## 2020-10-12 ENCOUNTER — Ambulatory Visit (INDEPENDENT_AMBULATORY_CARE_PROVIDER_SITE_OTHER): Payer: Medicare Other | Admitting: Urology

## 2020-10-12 ENCOUNTER — Encounter: Payer: Self-pay | Admitting: Urology

## 2020-10-12 ENCOUNTER — Other Ambulatory Visit: Payer: Self-pay

## 2020-10-12 ENCOUNTER — Other Ambulatory Visit: Payer: Self-pay | Admitting: *Deleted

## 2020-10-12 VITALS — BP 166/97 | HR 101

## 2020-10-12 DIAGNOSIS — C61 Malignant neoplasm of prostate: Secondary | ICD-10-CM

## 2020-10-12 DIAGNOSIS — N138 Other obstructive and reflux uropathy: Secondary | ICD-10-CM

## 2020-10-12 DIAGNOSIS — N401 Enlarged prostate with lower urinary tract symptoms: Secondary | ICD-10-CM | POA: Diagnosis not present

## 2020-10-12 NOTE — Progress Notes (Signed)
10/12/2020 12:10 PM   [name] 1941-05-19 [id number]  Referring provider: Marguerita Merles, [place],  [place] [address]  Chief Complaint  Patient presents with  . Prostate Cancer    HPI: 80 year old male with personal history of prostate cancer and BPH who returns today for 75-month follow-up.  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  He under went UroLift on 03/2020 as well as cystolitholapaxy prior to undergoing XRT + 6 months ADT for his prostate cancer (unfavorable intermediate risk).  Today, he returns having completed radiation.  He did well with this.  His PSA remains undetectable.  It was as high as 17.7 on 01/2020 prior to treatment.  PSA on 10/10/2020 undetectable.  In terms of urinary symptoms, he was doing exceptionally well just following your left prior to starting radiation.  He reports he has setback during radiation with increased urgency frequency and nocturia.  He was restarted on Flomax by the radiation oncologist.  Over the past month or so, his symptoms are returning back to baseline.  He gets up about 2x nightly to urinate with some improving daytime urgency but no incontinence.  No dysuria gross hematuria.    PMH: Past Medical History:  Diagnosis Date  . Acute appendicitis    perforated  . Anal fistula   . Bladder stones   . Diverticulitis   . Hemorrhoids, internal   . Hypertension   . Inguinal hernia     Surgical History: Past Surgical History:  Procedure Laterality Date  . APPENDECTOMY    . colonoscopy 2010    . CYSTOSCOPY WITH INSERTION OF UROLIFT N/A 03/07/2020   Procedure: CYSTOSCOPY WITH INSERTION OF UROLIFT;  Surgeon: Hollice Espy, MD;  Location: [facility];  Service: Urology;  Laterality: N/A;  . CYSTOSCOPY WITH LITHOLAPAXY N/A 03/07/2020   Procedure: CYSTOSCOPY WITH LITHOLAPAXY;  Surgeon: Hollice Espy, MD;  Location: [facility];  Service: Urology;  Laterality:  N/A;  . HERNIA REPAIR  02/04/2012   Dr. Burt Knack  . INGUINAL HERNIA REPAIR Left 05/06/2019   Procedure: HERNIA REPAIR INGUINAL ADULT OPEN WITH MESH, LEFT;  Surgeon: Herbert Pun, MD;  Location: [facility];  Service: General;  Laterality: Left;  . INGUINAL HERNIA REPAIR  2013   Dr. Burt Knack  . LAPAROSCOPIC APPENDECTOMY  07/03/2011   Dr Burt Knack  . RECTAL SURGERY  08/08/2009   Dr. Pat Patrick    Home Medications:  Allergies as of 10/12/2020      Reactions   Clindamycin/lincomycin Rash   Tachycardia      Medication List       Accurate as of October 12, 2020 12:10 PM. If you have any questions, ask your nurse or doctor.        STOP taking these medications   dutasteride 0.5 MG capsule Commonly known as: AVODART Stopped by: Hollice Espy, MD   tamsulosin 0.4 MG Caps capsule Commonly known as: FLOMAX Stopped by: Hollice Espy, MD     TAKE these medications   amLODipine 5 MG tablet Commonly known as: NORVASC Take 5 mg by mouth daily.   hydrochlorothiazide 25 MG tablet Commonly known as: HYDRODIURIL Take 25 mg by mouth at bedtime.   HYDROcodone-acetaminophen 5-325 MG tablet Commonly known as: NORCO/VICODIN Take 1-2 tablets by mouth every 6 (six) hours as needed for moderate pain.   levothyroxine 50 MCG tablet Commonly known as: SYNTHROID Take 50 mcg by mouth daily.   lovastatin 40 MG tablet  Commonly known as: MEVACOR Take 80 mg by mouth at bedtime.       Allergies:  Allergies  Allergen Reactions  . Clindamycin/Lincomycin Rash    Tachycardia     Family History: Family History  Problem Relation Age of Onset  . Hypertension Mother   . Cancer Father        prostate  . Hypertension Father   . Kidney disease Neg Hx   . Kidney cancer Neg Hx   . Bladder Cancer Neg Hx     Social History:  reports that he has never smoked. He has never used smokeless tobacco. He reports current alcohol use. He reports that he does not use drugs.   Physical Exam: BP (!) 166/97    Pulse (!) 101   Constitutional:  Alert and oriented, No acute distress. HEENT: [place] AT, moist mucus membranes.  Trachea midline, no masses. Cardiovascular: No clubbing, cyanosis, or edema. Respiratory: Normal respiratory effort, no increased work of breathing. Skin: No rashes, bruises or suspicious lesions. Neurologic: Grossly intact, no focal deficits, moving all 4 extremities. Psychiatric: Normal mood and affect.   Pertinent Imaging: Results for orders placed or performed in visit on 10/10/20  PSA  Result Value Ref Range   Prostate Specific Ag, Serum <0.1 0.0 - 4.0 ng/mL    PVR 2 cc  Assessment & Plan:    1. Prostate cancer ([place]) NED  s/p IMRT + ADT x 6 months Plan for repeat PSA in 6 months  2. BPH with obstruction/lower urinary tract symptoms Symptoms improving after treatment of bladder stones and UroLift Slight setback with urgency frequency following radiation but this also improving Okay to DC dutasteride this point in time along with flomax If he does have worsening symptoms, will resume Flomax and consider OAB type med for irritative urinary symptoms  F/u 6 months IPSS/ PVR/ PSA  Hollice Espy, MD  [facility] [address], [place], [place] [address] [phone]

## 2020-12-16 ENCOUNTER — Telehealth: Payer: Self-pay | Admitting: *Deleted

## 2020-12-16 NOTE — Telephone Encounter (Signed)
Left VM to return call to discuss further 

## 2020-12-16 NOTE — Telephone Encounter (Signed)
Unfortunately this is not a reason to excuse a person from jury duty.  He can be provided with a note indicating that he has a medical condition which requires frequent trips to the bathroom and please excuse him to use the restroom as needed.  Hollice Espy, MD

## 2020-12-16 NOTE — Telephone Encounter (Signed)
Patient called to request a note for jury duty-to be excused due to OAB. Patient requests refill of Flomax.

## 2020-12-17 ENCOUNTER — Other Ambulatory Visit: Payer: Self-pay | Admitting: Urology

## 2020-12-26 ENCOUNTER — Telehealth: Payer: Self-pay

## 2020-12-26 NOTE — Telephone Encounter (Signed)
Patient notified and scheduled 

## 2020-12-26 NOTE — Telephone Encounter (Signed)
Incoming call on triage line from patient in regards to an Rx request. Patient is requesting a medication for hot flashes. He would like it sent to the [facility] in [place].

## 2020-12-26 NOTE — Telephone Encounter (Signed)
Hot flashes should be decreasing or subsiding.  If not, he can be seen in the office or via virtual visit with Sam or Larene Beach to discuss options.  Hollice Espy, MD

## 2020-12-26 NOTE — Progress Notes (Signed)
12/27/2020 11:59 AM   [name] [birth year] [id number]  Referring provider: Marguerita Merles, [place],  [place] [address]  Chief Complaint  Patient presents with  . Hot Flashes   Urological History: 1. Prostate cancer - unfavorable intermediate risk - completed XRT + 6 months ADT  - PSA <0.1 ng/mL in 10/2020  2. BPH with LU TS - s/p UroLIft 03/2020  3. Bladder stones - s/p cystolitholapaxy 03/2020  HPI: [name] is a 80 y.o. male who presents today to discuss hot flashes.    His last ADT injection was February 26, 2020.  He continues to have frequent hot flashes that can be quite intense.  He states he is having them 3-4 times weekly.  He states sometimes the hot flashes during the night are soaking the bed.   PMH: Past Medical History:  Diagnosis Date  . Acute appendicitis    perforated  . Anal fistula   . Bladder stones   . Diverticulitis   . Hemorrhoids, internal   . Hypertension   . Inguinal hernia     Surgical History: Past Surgical History:  Procedure Laterality Date  . APPENDECTOMY    . colonoscopy 2010    . CYSTOSCOPY WITH INSERTION OF UROLIFT N/A 03/07/2020   Procedure: CYSTOSCOPY WITH INSERTION OF UROLIFT;  Surgeon: Hollice Espy, MD;  Location: [facility];  Service: Urology;  Laterality: N/A;  . CYSTOSCOPY WITH LITHOLAPAXY N/A 03/07/2020   Procedure: CYSTOSCOPY WITH LITHOLAPAXY;  Surgeon: Hollice Espy, MD;  Location: [facility];  Service: Urology;  Laterality: N/A;  . HERNIA REPAIR  02/04/2012   Dr. Burt Knack  . INGUINAL HERNIA REPAIR Left 05/06/2019   Procedure: HERNIA REPAIR INGUINAL ADULT OPEN WITH MESH, LEFT;  Surgeon: Herbert Pun, MD;  Location: [facility];  Service: General;  Laterality: Left;  . INGUINAL HERNIA REPAIR  2013   Dr. Burt Knack  . LAPAROSCOPIC APPENDECTOMY  07/03/2011   Dr Burt Knack  . RECTAL SURGERY  08/08/2009   Dr. Pat Patrick    Home Medications:  Allergies as of 12/27/2020      Reactions    Clindamycin/lincomycin Rash   Tachycardia      Medication List       Accurate as of December 27, 2020 11:59 AM. If you have any questions, ask your nurse or doctor.        amLODipine 5 MG tablet Commonly known as: NORVASC Take 5 mg by mouth daily.   gabapentin 300 MG capsule Commonly known as: Neurontin Take 1 capsule (300 mg total) by mouth at bedtime. Started by: Zara Council, PA-C   hydrochlorothiazide 25 MG tablet Commonly known as: HYDRODIURIL Take 25 mg by mouth at bedtime.   HYDROcodone-acetaminophen 5-325 MG tablet Commonly known as: NORCO/VICODIN Take 1-2 tablets by mouth every 6 (six) hours as needed for moderate pain.   levothyroxine 50 MCG tablet Commonly known as: SYNTHROID Take 50 mcg by mouth daily.   lovastatin 40 MG tablet Commonly known as: MEVACOR Take 80 mg by mouth at bedtime.   tamsulosin 0.4 MG Caps capsule Commonly known as: FLOMAX TAKE (1) CAPSULE BY MOUTH ONCE DAILY.       Allergies:  Allergies  Allergen Reactions  . Clindamycin/Lincomycin Rash    Tachycardia     Family History: Family History  Problem Relation Age of Onset  . Hypertension Mother   . Cancer Father        prostate  . Hypertension Father   . Kidney disease Neg  Hx   . Kidney cancer Neg Hx   . Bladder Cancer Neg Hx     Social History:  reports that he has never smoked. He has never used smokeless tobacco. He reports current alcohol use. He reports that he does not use drugs.  ROS: Pertinent ROS in HPI  Physical Exam: BP (!) 148/76   Pulse (!) 103   Ht 5\' 8"  (1.727 m)   Wt 192 lb 12.8 oz (87.5 kg)   BMI 29.32 kg/m   Constitutional:  Well nourished. Alert and oriented, No acute distress. HEENT: [place] AT, mask in place.  Trachea midline Cardiovascular: No clubbing, cyanosis, or edema. Respiratory: Normal respiratory effort, no increased work of breathing. Neurologic: Grossly intact, no focal deficits, moving all 4 extremities. Psychiatric: Normal mood  and affect.  Laboratory Data: Lab Results  Component Value Date   WBC 9.1 02/26/2020   HGB 15.6 02/26/2020   HCT 43.4 02/26/2020   MCV 81.9 02/26/2020   PLT 266 02/26/2020    Lab Results  Component Value Date   CREATININE 1.31 (H) 02/26/2020  I have reviewed the labs.   Pertinent Imaging: No recent imaging  Assessment & Plan:    1. Hot flashes - discussed with patient why he is likely having hot flashes - discussed with patient the various treatment options for hot flashes in men - given it more time - megace 40 mg daily with a risk of prostate cancer and gynecomastia - gabapentin 300 mg to 900 mg daily with the side effect of drowsiness - venlafaxine 75 mg daily with the side effect of insomnia, dry mouth, constipation - paroxetine 10 mg daily with similar side effects of venlafaxine - citalopram 10 mg to 20 mg daily similar side effects of venlafaxine - fluoxetine 20 mg dialy similar side effects of venlafaxine - he would like to go ahead and try 300 mg of gabapentin qhs to see if it will reduce his hot flashes  Return in about 1 month (around 01/24/2021) for Symptom recheck.  These notes generated with voice recognition software. I apologize for typographical errors.  Zara Council, PA-C  [facility] [address]  [place], [place] [address] [phone]  I spent 30 minutes on the day of the encounter to include pre-visit record review, face-to-face time with the patient, and post-visit ordering of tests.

## 2020-12-27 ENCOUNTER — Encounter: Payer: Self-pay | Admitting: Urology

## 2020-12-27 ENCOUNTER — Other Ambulatory Visit: Payer: Self-pay

## 2020-12-27 ENCOUNTER — Ambulatory Visit (INDEPENDENT_AMBULATORY_CARE_PROVIDER_SITE_OTHER): Payer: Medicare Other | Admitting: Urology

## 2020-12-27 VITALS — BP 148/76 | HR 103 | Ht 68.0 in | Wt 192.8 lb

## 2020-12-27 DIAGNOSIS — R232 Flushing: Secondary | ICD-10-CM | POA: Diagnosis not present

## 2020-12-27 MED ORDER — GABAPENTIN 300 MG PO CAPS
300.0000 mg | ORAL_CAPSULE | Freq: Every day | ORAL | 1 refills | Status: DC
Start: 2020-12-27 — End: 2021-04-18

## 2021-01-24 NOTE — Progress Notes (Incomplete)
01/25/2021 9:06 PM   [name] August 05, 1941 [id number]  Referring provider: Marguerita Merles, [place],  [place] [address]  No chief complaint on file.  Urological History: 1. Prostate cancer - unfavorable intermediate risk - completed XRT + 6 months ADT  - PSA <0.1 ng/mL in 10/2020  2. BPH with LU TS - s/p UroLIft 03/2020  3. Bladder stones - s/p cystolitholapaxy 03/2020  HPI: [name] is a 80 y.o. male who presents today for a one month follow up after starting gabapentin for his hot flashes.     PMH: Past Medical History:  Diagnosis Date  . Acute appendicitis    perforated  . Anal fistula   . Bladder stones   . Diverticulitis   . Hemorrhoids, internal   . Hypertension   . Inguinal hernia     Surgical History: Past Surgical History:  Procedure Laterality Date  . APPENDECTOMY    . colonoscopy 2010    . CYSTOSCOPY WITH INSERTION OF UROLIFT N/A 03/07/2020   Procedure: CYSTOSCOPY WITH INSERTION OF UROLIFT;  Surgeon: Hollice Espy, MD;  Location: [facility];  Service: Urology;  Laterality: N/A;  . CYSTOSCOPY WITH LITHOLAPAXY N/A 03/07/2020   Procedure: CYSTOSCOPY WITH LITHOLAPAXY;  Surgeon: Hollice Espy, MD;  Location: [facility];  Service: Urology;  Laterality: N/A;  . HERNIA REPAIR  02/04/2012   Dr. Burt Knack  . INGUINAL HERNIA REPAIR Left 05/06/2019   Procedure: HERNIA REPAIR INGUINAL ADULT OPEN WITH MESH, LEFT;  Surgeon: Herbert Pun, MD;  Location: [facility];  Service: General;  Laterality: Left;  . INGUINAL HERNIA REPAIR  2013   Dr. Burt Knack  . LAPAROSCOPIC APPENDECTOMY  07/03/2011   Dr Burt Knack  . RECTAL SURGERY  08/08/2009   Dr. Pat Patrick    Home Medications:  Allergies as of 01/25/2021      Reactions   Clindamycin/lincomycin Rash   Tachycardia      Medication List       Accurate as of January 24, 2021  9:06 PM. If you have any questions, ask your nurse or doctor.        amLODipine 5 MG tablet Commonly known as:  NORVASC Take 5 mg by mouth daily.   gabapentin 300 MG capsule Commonly known as: Neurontin Take 1 capsule (300 mg total) by mouth at bedtime.   hydrochlorothiazide 25 MG tablet Commonly known as: HYDRODIURIL Take 25 mg by mouth at bedtime.   HYDROcodone-acetaminophen 5-325 MG tablet Commonly known as: NORCO/VICODIN Take 1-2 tablets by mouth every 6 (six) hours as needed for moderate pain.   levothyroxine 50 MCG tablet Commonly known as: SYNTHROID Take 50 mcg by mouth daily.   lovastatin 40 MG tablet Commonly known as: MEVACOR Take 80 mg by mouth at bedtime.   tamsulosin 0.4 MG Caps capsule Commonly known as: FLOMAX TAKE (1) CAPSULE BY MOUTH ONCE DAILY.       Allergies:  Allergies  Allergen Reactions  . Clindamycin/Lincomycin Rash    Tachycardia     Family History: Family History  Problem Relation Age of Onset  . Hypertension Mother   . Cancer Father        prostate  . Hypertension Father   . Kidney disease Neg Hx   . Kidney cancer Neg Hx   . Bladder Cancer Neg Hx     Social History:  reports that he has never smoked. He has never used smokeless tobacco. He reports current alcohol use. He reports that he does not use drugs.  ROS: Pertinent ROS in HPI  Physical Exam: There were no vitals taken for this visit.  Constitutional:  Well nourished. Alert and oriented, No acute distress. HEENT: [place] AT, moist mucus membranes.  Trachea midline Cardiovascular: No clubbing, cyanosis, or edema. Respiratory: Normal respiratory effort, no increased work of breathing. GI: Abdomen is soft, non tender, non distended, no abdominal masses. Liver and spleen not palpable.  No hernias appreciated.  Stool sample for occult testing is not indicated.   GU: No CVA tenderness.  No bladder fullness or masses.  Patient with circumcised/uncircumcised phallus. ***Foreskin easily retracted***  Urethral meatus is patent.  No penile discharge. No penile lesions or rashes. Scrotum without  lesions, cysts, rashes and/or edema.  Testicles are located scrotally bilaterally. No masses are appreciated in the testicles. Left and right epididymis are normal. Rectal: Patient with  normal sphincter tone. Anus and perineum without scarring or rashes. No rectal masses are appreciated. Prostate is approximately *** grams, *** nodules are appreciated. Seminal vesicles are normal. Skin: No rashes, bruises or suspicious lesions. Lymph: No inguinal adenopathy. Neurologic: Grossly intact, no focal deficits, moving all 4 extremities. Psychiatric: Normal mood and affect.   Laboratory Data: *** I have reviewed the labs.   Pertinent Imaging: No recent imaging  Assessment & Plan:    1. Hot flashes - ***  2. Prostate cancer -keep follow up in June  No follow-ups on file.  These notes generated with voice recognition software. I apologize for typographical errors.  Zara Council, PA-C  [facility] [address]  [place],  [address] [phone]

## 2021-01-25 ENCOUNTER — Ambulatory Visit: Payer: Self-pay | Admitting: Urology

## 2021-01-25 DIAGNOSIS — R232 Flushing: Secondary | ICD-10-CM

## 2021-01-25 DIAGNOSIS — C61 Malignant neoplasm of prostate: Secondary | ICD-10-CM

## 2021-01-26 ENCOUNTER — Ambulatory Visit: Payer: Self-pay | Admitting: Urology

## 2021-03-27 ENCOUNTER — Other Ambulatory Visit: Payer: Self-pay | Admitting: Urology

## 2021-04-05 ENCOUNTER — Other Ambulatory Visit: Payer: Self-pay

## 2021-04-12 ENCOUNTER — Other Ambulatory Visit: Payer: Self-pay

## 2021-04-18 ENCOUNTER — Ambulatory Visit (INDEPENDENT_AMBULATORY_CARE_PROVIDER_SITE_OTHER): Payer: Medicare Other | Admitting: Urology

## 2021-04-18 ENCOUNTER — Encounter: Payer: Self-pay | Admitting: Urology

## 2021-04-18 ENCOUNTER — Other Ambulatory Visit: Payer: Self-pay

## 2021-04-18 VITALS — BP 144/79 | HR 118 | Ht 68.0 in | Wt 180.0 lb

## 2021-04-18 DIAGNOSIS — N401 Enlarged prostate with lower urinary tract symptoms: Secondary | ICD-10-CM

## 2021-04-18 DIAGNOSIS — N138 Other obstructive and reflux uropathy: Secondary | ICD-10-CM | POA: Diagnosis not present

## 2021-04-18 DIAGNOSIS — C61 Malignant neoplasm of prostate: Secondary | ICD-10-CM | POA: Diagnosis not present

## 2021-04-18 DIAGNOSIS — R232 Flushing: Secondary | ICD-10-CM

## 2021-04-18 LAB — BLADDER SCAN AMB NON-IMAGING: Scan Result: 28

## 2021-04-18 NOTE — Progress Notes (Signed)
04/18/2021 2:51 PM   [name] 10-Jul-1941 [id number]  Referring provider: Marguerita Merles, MD [place],  [place] [address]  Chief Complaint  Patient presents with   Prostate Cancer    HPI: 80 yo M with prostate cancer who returns today for routine 6 month f/u.  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  He under went UroLift on 03/2020 as well as cystolitholapaxy prior to undergoing XRT + 6 months ADT for his prostate cancer (unfavorable intermediate risk).   Last PSA checked by PCP was 0.2 on 04/04/2021.  He was seen by Lavell Islam back in February for severe hot flashes.  He was started on gabapentin 300 mg.  He is no longer using this and his hot flashs have stopped.  He continues to take Flomax.  IPSS as below.    IPSS     Row Name 04/18/21 1400         International Prostate Symptom Score   How often have you had the sensation of not emptying your bladder? Not at All     How often have you had to urinate less than every two hours? Less than 1 in 5 times     How often have you found you stopped and started again several times when you urinated? Not at All     How often have you found it difficult to postpone urination? Not at All     How often have you had a weak urinary stream? Not at All     How often have you had to strain to start urination? Not at All     How many times did you typically get up at night to urinate? 2 Times     Total IPSS Score 3           Quality of Life due to urinary symptoms     If you were to spend the rest of your life with your urinary condition just the way it is now how would you feel about that? Pleased             Score:  1-7 Mild 8-19 Moderate 20-35 Severe   PMH: Past Medical History:  Diagnosis Date   Acute appendicitis    perforated   Anal fistula    Bladder stones    Diverticulitis    Hemorrhoids, internal    Hypertension    Inguinal  hernia    Prostate cancer ([place])     Surgical History: Past Surgical History:  Procedure Laterality Date   APPENDECTOMY     colonoscopy 2010     CYSTOSCOPY WITH INSERTION OF UROLIFT N/A 03/07/2020   Procedure: CYSTOSCOPY WITH INSERTION OF UROLIFT;  Surgeon: Hollice Espy, MD;  Location: [facility];  Service: Urology;  Laterality: N/A;   CYSTOSCOPY WITH LITHOLAPAXY N/A 03/07/2020   Procedure: CYSTOSCOPY WITH LITHOLAPAXY;  Surgeon: Hollice Espy, MD;  Location: [facility];  Service: Urology;  Laterality: N/A;   HERNIA REPAIR  02/04/2012   Dr. Jason Nest HERNIA REPAIR Left 05/06/2019   Procedure: HERNIA REPAIR INGUINAL ADULT OPEN WITH MESH, LEFT;  Surgeon: Herbert Pun, MD;  Location: [facility];  Service: General;  Laterality: Left;   INGUINAL HERNIA REPAIR  2013   Dr. Burt Knack   LAPAROSCOPIC APPENDECTOMY  07/03/2011   Dr Burt Knack   RECTAL SURGERY  08/08/2009   Dr. Pat Patrick    Home Medications:  Allergies as  of 04/18/2021       Reactions   Clindamycin/lincomycin Rash   Tachycardia        Medication List        Accurate as of April 18, 2021  2:51 PM. If you have any questions, ask your nurse or doctor.          amLODipine 5 MG tablet Commonly known as: NORVASC Take 5 mg by mouth daily.   gabapentin 300 MG capsule Commonly known as: Neurontin Take 1 capsule (300 mg total) by mouth at bedtime.   hydrochlorothiazide 25 MG tablet Commonly known as: HYDRODIURIL Take 25 mg by mouth at bedtime.   HYDROcodone-acetaminophen 5-325 MG tablet Commonly known as: NORCO/VICODIN Take 1-2 tablets by mouth every 6 (six) hours as needed for moderate pain.   levothyroxine 50 MCG tablet Commonly known as: SYNTHROID Take 50 mcg by mouth daily.   lovastatin 40 MG tablet Commonly known as: MEVACOR Take 80 mg by mouth at bedtime.   tamsulosin 0.4 MG Caps capsule Commonly known as: FLOMAX TAKE (1) CAPSULE BY MOUTH ONCE DAILY.        Allergies:  Allergies  Allergen  Reactions   Clindamycin/Lincomycin Rash    Tachycardia     Family History: Family History  Problem Relation Age of Onset   Hypertension Mother    Cancer Father        prostate   Hypertension Father    Kidney disease Neg Hx    Kidney cancer Neg Hx    Bladder Cancer Neg Hx     Social History:  reports that he has never smoked. He has never used smokeless tobacco. He reports current alcohol use. He reports that he does not use drugs.   Physical Exam: BP (!) 144/79   Pulse (!) 118   Ht 5\' 8"  (1.727 m)   Wt 180 lb (81.6 kg)   BMI 27.37 kg/m   Constitutional:  Alert and oriented, No acute distress. HEENT: [place] AT, moist mucus membranes.  Trachea midline, no masses. Cardiovascular: No clubbing, cyanosis, or edema. Respiratory: Normal respiratory effort, no increased work of breathing. Skin: No rashes, bruises or suspicious lesions. Neurologic: Grossly intact, no focal deficits, moving all 4 extremities. Psychiatric: Normal mood and affect.  Results for orders placed or performed in visit on 04/18/21  BLADDER SCAN AMB NON-IMAGING  Result Value Ref Range   Scan Result 28      Assessment & Plan:    1. BPH with obstruction/lower urinary tract symptoms Symptoms well controlled on flomax  Adequate emptying today - BLADDER SCAN AMB NON-IMAGING  2. Prostate cancer [facility]) NED, continue to trend PSA  3. Hot flashes Resolved, no longer taking gabapentin   F/u 6 months for PSA/ IPSS/ SHIM  Hollice Espy, MD  [facility] [address] [place], [place] [address] [phone]

## 2021-04-20 ENCOUNTER — Ambulatory Visit: Payer: Self-pay | Admitting: Urology

## 2021-10-18 ENCOUNTER — Other Ambulatory Visit: Payer: Self-pay

## 2021-10-19 ENCOUNTER — Other Ambulatory Visit: Payer: Self-pay

## 2021-10-22 NOTE — Progress Notes (Signed)
10/24/21 11:05 AM   [name] 01-03-1941 [id number]  Referring provider:  Marguerita Merles, [place],  [place] [address] Chief Complaint  Patient presents with   Prostate Cancer     HPI: [name] is a 80 y.o.male with a personal history of unfavorable intermediate risk prostate cancer, BPH with obstruction/ LUTS, and hot flashes who presents today for 6 month follow-up with IPSS, PSA, SHIM.   He underwent UroLift on 03/2020 as well as cystolitholapaxy prior to undergoing XRT + 6 months ADT for his prostate cancer (unfavorable intermediate risk).  His path report indicates 2 of 3 cores involved Gleason score 4+3 up to 60% of tissue primarily at left mid lateral left apex lateral and left mid. Prostate 43g.   PSA on 10/17/2021 was 0.2.  He is doing well today on a urological standpoint.  He stopped taking Flomax after last visit and he seems to be doing stably well off this medication.  He is happy about not being on meds.  He is worried today about his blood pressure.    IPSS     Row Name 10/24/21 1000         International Prostate Symptom Score   How often have you had the sensation of not emptying your bladder? Less than 1 in 5     How often have you had to urinate less than every two hours? Less than 1 in 5 times     How often have you found you stopped and started again several times when you urinated? Not at All     How often have you found it difficult to postpone urination? Less than 1 in 5 times     How often have you had a weak urinary stream? Less than 1 in 5 times     How often have you had to strain to start urination? Not at All     How many times did you typically get up at night to urinate? 3 Times     Total IPSS Score 7       Quality of Life due to urinary symptoms   If you were to spend the rest of your life with your urinary condition just the way it is now how would you feel about that? Pleased              Score:   1-7 Mild 8-19 Moderate 20-35 Severe   PMH: Past Medical History:  Diagnosis Date   Acute appendicitis    perforated   Anal fistula    Bladder stones    Diverticulitis    Hemorrhoids, internal    Hypertension    Inguinal hernia    Prostate cancer ([place])     Surgical History: Past Surgical History:  Procedure Laterality Date   APPENDECTOMY     colonoscopy 2010     CYSTOSCOPY WITH INSERTION OF UROLIFT N/A 03/07/2020   Procedure: CYSTOSCOPY WITH INSERTION OF UROLIFT;  Surgeon: Hollice Espy, MD;  Location: [facility];  Service: Urology;  Laterality: N/A;   CYSTOSCOPY WITH LITHOLAPAXY N/A 03/07/2020   Procedure: CYSTOSCOPY WITH LITHOLAPAXY;  Surgeon: Hollice Espy, MD;  Location: [facility];  Service: Urology;  Laterality: N/A;   HERNIA REPAIR  02/04/2012   Dr. Jason Nest HERNIA REPAIR Left 05/06/2019   Procedure: HERNIA REPAIR INGUINAL ADULT OPEN WITH MESH, LEFT;  Surgeon: Herbert Pun, MD;  Location: [facility];  Service: General;  Laterality: Left;   INGUINAL HERNIA  REPAIR  2013   Dr. Burt Knack   LAPAROSCOPIC APPENDECTOMY  07/03/2011   Dr Burt Knack   RECTAL SURGERY  08/08/2009   Dr. Pat Patrick    Home Medications:  Allergies as of 10/24/2021       Reactions   Clindamycin/lincomycin Rash   Tachycardia        Medication List        Accurate as of October 24, 2021 11:05 AM. If you have any questions, ask your nurse or doctor.          STOP taking these medications    HYDROcodone-acetaminophen 5-325 MG tablet Commonly known as: NORCO/VICODIN Stopped by: Hollice Espy, MD   levothyroxine 50 MCG tablet Commonly known as: SYNTHROID Stopped by: Hollice Espy, MD   tamsulosin 0.4 MG Caps capsule Commonly known as: FLOMAX Stopped by: Hollice Espy, MD       TAKE these medications    amLODipine 5 MG tablet Commonly known as: NORVASC Take 5 mg by mouth daily.   hydrochlorothiazide 25 MG tablet Commonly known as: HYDRODIURIL Take 25 mg by mouth at  bedtime.   lovastatin 40 MG tablet Commonly known as: MEVACOR Take 80 mg by mouth at bedtime.        Allergies:  Allergies  Allergen Reactions   Clindamycin/Lincomycin Rash    Tachycardia     Family History: Family History  Problem Relation Age of Onset   Hypertension Mother    Cancer Father        prostate   Hypertension Father    Kidney disease Neg Hx    Kidney cancer Neg Hx    Bladder Cancer Neg Hx     Social History:  reports that he has never smoked. He has never used smokeless tobacco. He reports current alcohol use. He reports that he does not use drugs.   Physical Exam: BP 135/80    Pulse 77    Ht 5\' 8"  (1.727 m)    Wt 189 lb (85.7 kg)    BMI 28.74 kg/m   Constitutional:  Alert and oriented, No acute distress. HEENT: [place] AT, moist mucus membranes.  Trachea midline, no masses. Cardiovascular: No clubbing, cyanosis, or edema. Respiratory: Normal respiratory effort, no increased work of breathing. Skin: No rashes, bruises or suspicious lesions. Neurologic: Grossly intact, no focal deficits, moving all 4 extremities. Psychiatric: Normal mood and affect.  Laboratory Data:  Lab Results  Component Value Date   CREATININE 1.31 (H) 02/26/2020    Results for orders placed or performed in visit on 04/18/21  BLADDER SCAN AMB NON-IMAGING  Result Value Ref Range   Scan Result 28     Assessment & Plan:    BPH with obstruction/LUTS symptoms  - Doing extremely well with successful urolift  -Would continue to abstain from Flomax, does not appear that he needs this medication  Prostate cancer  - PSA under control  - NED, continue to trend PSA   Return in 1 year  for PSA/ IPSS/ PVR  and PSA only in 6 months  I,Kailey Littlejohn,acting as a scribe for Hollice Espy, MD.,have documented all relevant documentation on the behalf of Hollice Espy, MD,as directed by  Hollice Espy, MD while in the presence of Hollice Espy, MD.  I have reviewed the above  documentation for accuracy and completeness, and I agree with the above.   Hollice Espy, MD   [facility] [address], [place], [place] [address] [phone]

## 2021-10-24 ENCOUNTER — Encounter: Payer: Self-pay | Admitting: Urology

## 2021-10-24 ENCOUNTER — Ambulatory Visit (INDEPENDENT_AMBULATORY_CARE_PROVIDER_SITE_OTHER): Payer: Medicare Other | Admitting: Urology

## 2021-10-24 ENCOUNTER — Other Ambulatory Visit: Payer: Self-pay

## 2021-10-24 VITALS — BP 135/80 | HR 77 | Ht 68.0 in | Wt 189.0 lb

## 2021-10-24 DIAGNOSIS — C61 Malignant neoplasm of prostate: Secondary | ICD-10-CM | POA: Diagnosis not present

## 2022-04-24 ENCOUNTER — Other Ambulatory Visit: Payer: Medicare Other

## 2022-04-25 ENCOUNTER — Other Ambulatory Visit: Payer: Self-pay | Admitting: Family Medicine

## 2022-10-30 ENCOUNTER — Other Ambulatory Visit: Payer: Self-pay | Admitting: *Deleted

## 2022-10-30 DIAGNOSIS — C61 Malignant neoplasm of prostate: Secondary | ICD-10-CM

## 2022-11-01 ENCOUNTER — Other Ambulatory Visit: Payer: Medicare Other

## 2022-11-14 ENCOUNTER — Ambulatory Visit: Payer: Medicare Other | Admitting: Urology

## 2022-12-07 ENCOUNTER — Other Ambulatory Visit: Payer: Medicare Other

## 2022-12-07 DIAGNOSIS — C61 Malignant neoplasm of prostate: Secondary | ICD-10-CM

## 2022-12-08 LAB — PSA: Prostate Specific Ag, Serum: 0.1 ng/mL (ref 0.0–4.0)

## 2022-12-11 ENCOUNTER — Ambulatory Visit (INDEPENDENT_AMBULATORY_CARE_PROVIDER_SITE_OTHER): Payer: Medicare Other | Admitting: Urology

## 2022-12-11 VITALS — BP 165/80 | HR 94 | Ht 68.0 in | Wt 182.5 lb

## 2022-12-11 DIAGNOSIS — N401 Enlarged prostate with lower urinary tract symptoms: Secondary | ICD-10-CM | POA: Diagnosis not present

## 2022-12-11 DIAGNOSIS — N138 Other obstructive and reflux uropathy: Secondary | ICD-10-CM

## 2022-12-11 DIAGNOSIS — C61 Malignant neoplasm of prostate: Secondary | ICD-10-CM | POA: Diagnosis not present

## 2022-12-11 DIAGNOSIS — N210 Calculus in bladder: Secondary | ICD-10-CM

## 2022-12-11 LAB — BLADDER SCAN AMB NON-IMAGING: Scan Result: 3

## 2022-12-11 MED ORDER — TAMSULOSIN HCL 0.4 MG PO CAPS: 0.4000 mg | ORAL_CAPSULE | Freq: Every day | ORAL | 3 refills | Status: DC

## 2022-12-11 NOTE — Progress Notes (Signed)
I, DeAsia L Maxie,acting as a scribe for Hollice Espy, MD.,have documented all relevant documentation on the behalf of Hollice Espy, MD,as directed by  Hollice Espy, MD while in the presence of Hollice Espy, MD.   I, [place] as a scribe for Hollice Espy, MD.,have documented all relevant documentation on the behalf of Hollice Espy, MD,as directed by  Hollice Espy, MD while in the presence of Hollice Espy, MD.   12/11/22 4:30 PM   [name] 06/27/1941 [id number]  Referring provider: Marguerita Merles, [place],  [place] [address]  Chief Complaint  Patient presents with   Benign Prostatic Hypertrophy   Prostate Cancer    HPI: 82 year-old male with a personal history of BPH and prostate cancer who returns today for an annual follow-up.  He underwent UroLift on 03/2020 as well as cystolitholapaxy prior to undergoing XRT + 6 months ADT for his prostate cancer (unfavorable intermediate risk). He was diagnosed with Gleason 4+3 up to 60% of the tissue at that time.  His most recent PSA was 0.1 on 12/07/22.   Reports minimal urinary symptoms, pleased with urinary function post Urolift. He reports discontinuing Flomax previously but had to resume it after 8-10 months due to urinary issues.  Results for orders placed or performed in visit on 12/11/22  Bladder Scan (Post Void Residual) in office  Result Value Ref Range   Scan Result 3 ml     IPSS     Row Name 12/11/22 1500         International Prostate Symptom Score   How often have you had the sensation of not emptying your bladder? Less than 1 in 5     How often have you had to urinate less than every two hours? Less than 1 in 5 times     How often have you found you stopped and started again several times when you urinated? Not at All     How often have you found it difficult to postpone urination? Not at All     How often have you had a weak urinary stream? Less than 1 in 5 times      How often have you had to strain to start urination? Not at All     How many times did you typically get up at night to urinate? 2 Times     Total IPSS Score 5       Quality of Life due to urinary symptoms   If you were to spend the rest of your life with your urinary condition just the way it is now how would you feel about that? Pleased              Score:  1-7 Mild 8-19 Moderate 20-35 Severe    PMH: Past Medical History:  Diagnosis Date   Acute appendicitis    perforated   Anal fistula    Bladder stones    Diverticulitis    Hemorrhoids, internal    Hypertension    Inguinal hernia    Prostate cancer ([place])     Surgical History: Past Surgical History:  Procedure Laterality Date   APPENDECTOMY     colonoscopy 2010     CYSTOSCOPY WITH INSERTION OF UROLIFT N/A 03/07/2020   Procedure: CYSTOSCOPY WITH INSERTION OF UROLIFT;  Surgeon: Hollice Espy, MD;  Location: [facility];  Service: Urology;  Laterality: N/A;   CYSTOSCOPY WITH LITHOLAPAXY N/A 03/07/2020   Procedure: CYSTOSCOPY WITH LITHOLAPAXY;  Surgeon: Hollice Espy, MD;  Location: [facility];  Service: Urology;  Laterality: N/A;   HERNIA REPAIR  02/04/2012   Dr. Jason Nest HERNIA REPAIR Left 05/06/2019   Procedure: HERNIA REPAIR INGUINAL ADULT OPEN WITH MESH, LEFT;  Surgeon: Herbert Pun, MD;  Location: [facility];  Service: General;  Laterality: Left;   INGUINAL HERNIA REPAIR  2013   Dr. Burt Knack   LAPAROSCOPIC APPENDECTOMY  07/03/2011   Dr Burt Knack   RECTAL SURGERY  08/08/2009   Dr. Pat Patrick    Home Medications:  Allergies as of 12/11/2022       Reactions   Clindamycin/lincomycin Rash   Tachycardia        Medication List        Accurate as of December 11, 2022  4:30 PM. If you have any questions, ask your nurse or doctor.          amLODipine 5 MG tablet Commonly known as: NORVASC Take 5 mg by mouth daily.   hydrochlorothiazide 25 MG tablet Commonly known as: HYDRODIURIL Take 25 mg by  mouth at bedtime.   levothyroxine 25 MCG tablet Commonly known as: SYNTHROID Take 25 mcg by mouth daily.   lovastatin 40 MG tablet Commonly known as: MEVACOR Take 80 mg by mouth at bedtime.   metFORMIN 500 MG tablet Commonly known as: GLUCOPHAGE Take 500 mg by mouth daily with breakfast.   tamsulosin 0.4 MG Caps capsule Commonly known as: FLOMAX Take 1 capsule (0.4 mg total) by mouth daily.        Allergies:  Allergies  Allergen Reactions   Clindamycin/Lincomycin Rash    Tachycardia     Family History: Family History  Problem Relation Age of Onset   Hypertension Mother    Cancer Father        prostate   Hypertension Father    Kidney disease Neg Hx    Kidney cancer Neg Hx    Bladder Cancer Neg Hx     Social History:  reports that he has never smoked. He has never used smokeless tobacco. He reports current alcohol use. He reports that he does not use drugs.   Physical Exam: BP (!) 165/80   Pulse 94   Ht 5' 8"$  (1.727 m)   Wt 182 lb 8 oz (82.8 kg)   BMI 27.75 kg/m   Constitutional:  Alert and oriented, No acute distress. HEENT: [place] AT, moist mucus membranes.  Trachea midline, no masses. Neurologic: Grossly intact, no focal deficits, moving all 4 extremities. Psychiatric: Normal mood and affect.  Assessment & Plan:    1. Prostate Cancer - Continue to monitor his PSA annually.   2. BPH  - S/p Urolift. He reports minimal urinary symptoms and is satisfied with his urinary function. - PVR within normal range of 3 ml - Continue Flomax, attempted to wean previously unsuccessfully but has no side effects  Return in 1 year (on 12/12/2023) for PSA/IPSS/PVR.  I have reviewed the above documentation for accuracy and completeness, and I agree with the above.   Hollice Espy, MD    [facility] [address] [place], [place] [address] [phone]

## 2023-03-20 ENCOUNTER — Ambulatory Visit (INDEPENDENT_AMBULATORY_CARE_PROVIDER_SITE_OTHER): Payer: Medicare Other | Admitting: Gastroenterology

## 2023-03-20 ENCOUNTER — Telehealth: Payer: Self-pay

## 2023-03-20 ENCOUNTER — Other Ambulatory Visit: Payer: Self-pay

## 2023-03-20 ENCOUNTER — Encounter: Payer: Self-pay | Admitting: Gastroenterology

## 2023-03-20 VITALS — BP 128/73 | HR 84 | Temp 98.6°F | Ht 68.0 in | Wt 185.4 lb

## 2023-03-20 DIAGNOSIS — Z86010 Personal history of colonic polyps: Secondary | ICD-10-CM | POA: Diagnosis not present

## 2023-03-20 MED ORDER — NA SULFATE-K SULFATE-MG SULF 17.5-3.13-1.6 GM/177ML PO SOLN: 354.0000 mL | Freq: Once | ORAL | 0 refills | Status: AC

## 2023-03-20 NOTE — Progress Notes (Signed)
Cody Repress, MD [address]  [address]  [place], [place] [address]  Main: [phone]  Fax: [phone]    Gastroenterology Consultation  Referring Provider:     Leanna Sato, MD Primary Care Physician:  Leanna Sato, MD Primary Gastroenterologist:  Dr. Arlyss Wilkerson Reason for Consultation: To discuss about colonoscopy        HPI:   [name] is a 82 y.o. male referred by Dr. Marvis Moeller, Doralee Albino, MD  for consultation & management of colonoscopy.  He had a colonoscopy in 2015, and 3 polyps were removed and he was recommended to undergo colonoscopy in 5 years.  He could not get it scheduled.  He is interested to undergo colonoscopy.  Patient is functionally independent with no major medical comorbidities.  He has prediabetes, on metformin.  He lives alone since his wife passed away. He denies having any GI concerns.  Reports having regular bowel movements, 1-2 times daily, denies any rectal bleeding, no known history of anemia.  NSAIDs: None  Antiplts/Anticoagulants/Anti thrombotics: None  GI Procedures: Colonoscopy in 2015, 3 polyps were removed DIAGNOSIS:  A COLON, DESCENDING, POLYP; COLD BIOPSY:  - TUBULAR ADENOMA.  - NEGATIVE FOR HIGH-GRADE DYSPLASIA AND MALIGNANCY.   B. COLON, DESCENDING, PROXIMAL, POLYP; COLD BIOPSY:  - TUBULAR ADENOMA.  - NEGATIVE FOR HIGH-GRADE DYSPLASIA AND MALIGNANCY.   C. COLON, TRANSVERSE, DISTAL POLYP; HOT SNARE:  - TUBULAR ADENOMA.  - NEGATIVE FOR HIGH-GRADE DYSPLASIA AND MALIGNANCY.   Past Medical History:  Diagnosis Date   Acute appendicitis    perforated   Anal fistula    Bladder stones    Diverticulitis    Hemorrhoids, internal    Hypertension    Inguinal hernia    Prostate cancer ([facility])     Past Surgical History:  Procedure Laterality Date   APPENDECTOMY     colonoscopy 2010     CYSTOSCOPY WITH INSERTION OF UROLIFT N/A 03/07/2020   Procedure: CYSTOSCOPY WITH INSERTION OF UROLIFT;  Surgeon: Vanna Scotland,  MD;  Location: [facility];  Service: Urology;  Laterality: N/A;   CYSTOSCOPY WITH LITHOLAPAXY N/A 03/07/2020   Procedure: CYSTOSCOPY WITH LITHOLAPAXY;  Surgeon: Vanna Scotland, MD;  Location: [facility];  Service: Urology;  Laterality: N/A;   HERNIA REPAIR  02/04/2012   Dr. Blanca Friend HERNIA REPAIR Left 05/06/2019   Procedure: HERNIA REPAIR INGUINAL ADULT OPEN WITH MESH, LEFT;  Surgeon: Carolan Shiver, MD;  Location: [facility];  Service: General;  Laterality: Left;   INGUINAL HERNIA REPAIR  2013   Dr. Excell Seltzer   LAPAROSCOPIC APPENDECTOMY  07/03/2011   Dr Excell Seltzer   RECTAL SURGERY  08/08/2009   Dr. Michela Pitcher     Current Outpatient Medications:    amLODipine (NORVASC) 5 MG tablet, Take 5 mg by mouth daily., Disp: , Rfl:    hydrochlorothiazide (HYDRODIURIL) 25 MG tablet, Take 25 mg by mouth at bedtime. , Disp: , Rfl: 2   levothyroxine (SYNTHROID) 25 MCG tablet, Take 25 mcg by mouth daily., Disp: , Rfl:    lovastatin (MEVACOR) 40 MG tablet, Take 80 mg by mouth at bedtime. , Disp: , Rfl:    metFORMIN (GLUCOPHAGE) 500 MG tablet, Take 500 mg by mouth daily with breakfast., Disp: , Rfl:    Na Sulfate-K Sulfate-Mg Sulf 17.5-3.13-1.6 GM/177ML SOLN, Take 354 mLs by mouth once for 1 dose., Disp: 354 mL, Rfl: 0   tamsulosin (FLOMAX) 0.4 MG CAPS capsule, Take 1 capsule (0.4 mg total) by mouth  daily., Disp: 90 capsule, Rfl: 3   Family History  Problem Relation Age of Onset   Hypertension Mother    Cancer Father        prostate   Hypertension Father    Kidney disease Neg Hx    Kidney cancer Neg Hx    Bladder Cancer Neg Hx      Social History   Tobacco Use   Smoking status: Never   Smokeless tobacco: Never  Vaping Use   Vaping Use: Never used  Substance Use Topics   Alcohol use: Yes    Comment: occassional   Drug use: No    Allergies as of 03/20/2023 - Review Complete 03/20/2023  Allergen Reaction Noted   Clindamycin/lincomycin Rash 05/20/2015    Review of Systems:    All systems  reviewed and negative except where noted in HPI.   Physical Exam:  BP 128/73 (BP Location: Left Arm, Patient Position: Sitting, Cuff Size: Normal)   Pulse 84   Temp 98.6 F (37 C) (Oral)   Ht 5\' 8"  (1.727 m)   Wt 185 lb 6 oz (84.1 kg)   BMI 28.19 kg/m  No LMP for male patient.  General:   Alert,  Well-developed, well-nourished, pleasant and cooperative in NAD Head:  Normocephalic and atraumatic. Eyes:  Sclera clear, no icterus.   Conjunctiva pink. Ears:  Normal auditory acuity. Nose:  No deformity, discharge, or lesions. Mouth:  No deformity or lesions,oropharynx pink & moist. Neck:  Supple; no masses or thyromegaly. Lungs:  Respirations even and unlabored.  Clear throughout to auscultation.   No wheezes, crackles, or rhonchi. No acute distress. Heart:  Regular rate and rhythm; no murmurs, clicks, rubs, or gallops. Abdomen:  Normal bowel sounds. Soft, non-tender and non-distended without masses, hepatosplenomegaly or hernias noted.  No guarding or rebound tenderness.   Rectal: Not performed Msk:  Symmetrical without gross deformities. Good, equal movement & strength bilaterally. Pulses:  Normal pulses noted. Extremities:  No clubbing or edema.  No cyanosis. Neurologic:  Alert and oriented x3;  grossly normal neurologically. Skin:  Intact without significant lesions or rashes. No jaundice. Psych:  Alert and cooperative. Normal mood and affect.  Imaging Studies: Reviewed  Assessment and Plan:   [name] is a 82 y.o. 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  The patient agrees with this plan & written consent will be obtained.     Follow up as needed   Cody Repress, MD

## 2023-03-20 NOTE — Telephone Encounter (Signed)
Forgot to tell patient to stop the metformin 3 days before procedure called and left a message for call back

## 2023-03-21 NOTE — Telephone Encounter (Signed)
Called and left a message for call back  

## 2023-03-22 NOTE — Telephone Encounter (Signed)
Patient called back and verbalized understanding of instructions  °

## 2023-04-09 ENCOUNTER — Encounter: Payer: Self-pay | Admitting: Gastroenterology

## 2023-04-10 ENCOUNTER — Ambulatory Visit: Payer: Medicare Other | Admitting: Anesthesiology

## 2023-04-10 ENCOUNTER — Encounter: Payer: Self-pay | Admitting: Gastroenterology

## 2023-04-10 ENCOUNTER — Encounter
Admission: RE | Disposition: A | Discharge status: Home / Self Care | Payer: Self-pay | Source: Home / Self Care | Attending: Gastroenterology

## 2023-04-10 ENCOUNTER — Other Ambulatory Visit: Payer: Self-pay

## 2023-04-10 ENCOUNTER — Ambulatory Visit
Admission: RE | Admit: 2023-04-10 | Discharge: 2023-04-10 | Disposition: A | Discharge status: Home / Self Care | Payer: Medicare Other | Attending: Gastroenterology | Admitting: Gastroenterology

## 2023-04-10 DIAGNOSIS — Z1211 Encounter for screening for malignant neoplasm of colon: Secondary | ICD-10-CM | POA: Diagnosis present

## 2023-04-10 DIAGNOSIS — K635 Polyp of colon: Secondary | ICD-10-CM

## 2023-04-10 DIAGNOSIS — Z8546 Personal history of malignant neoplasm of prostate: Secondary | ICD-10-CM | POA: Insufficient documentation

## 2023-04-10 DIAGNOSIS — Z860100 Personal history of colon polyps, unspecified: Secondary | ICD-10-CM

## 2023-04-10 DIAGNOSIS — Z09 Encounter for follow-up examination after completed treatment for conditions other than malignant neoplasm: Secondary | ICD-10-CM | POA: Insufficient documentation

## 2023-04-10 DIAGNOSIS — K644 Residual hemorrhoidal skin tags: Secondary | ICD-10-CM | POA: Insufficient documentation

## 2023-04-10 DIAGNOSIS — I10 Essential (primary) hypertension: Secondary | ICD-10-CM | POA: Diagnosis not present

## 2023-04-10 DIAGNOSIS — D122 Benign neoplasm of ascending colon: Secondary | ICD-10-CM | POA: Diagnosis not present

## 2023-04-10 DIAGNOSIS — Z08 Encounter for follow-up examination after completed treatment for malignant neoplasm: Secondary | ICD-10-CM | POA: Insufficient documentation

## 2023-04-10 DIAGNOSIS — D123 Benign neoplasm of transverse colon: Secondary | ICD-10-CM | POA: Insufficient documentation

## 2023-04-10 DIAGNOSIS — D120 Benign neoplasm of cecum: Secondary | ICD-10-CM | POA: Insufficient documentation

## 2023-04-10 DIAGNOSIS — K5730 Diverticulosis of large intestine without perforation or abscess without bleeding: Secondary | ICD-10-CM | POA: Diagnosis not present

## 2023-04-10 DIAGNOSIS — D124 Benign neoplasm of descending colon: Secondary | ICD-10-CM | POA: Insufficient documentation

## 2023-04-10 HISTORY — PX: COLONOSCOPY WITH PROPOFOL: SHX5780

## 2023-04-10 SURGERY — COLONOSCOPY WITH PROPOFOL
Anesthesia: General

## 2023-04-10 MED ORDER — PROPOFOL 10 MG/ML IV BOLUS
INTRAVENOUS | Status: DC | PRN
  Administered 2023-04-10: 30 mg via INTRAVENOUS

## 2023-04-10 MED ORDER — SODIUM CHLORIDE 0.9 % IV SOLN: INTRAVENOUS | Status: DC

## 2023-04-10 MED ORDER — PROPOFOL 500 MG/50ML IV EMUL
INTRAVENOUS | Status: DC | PRN
  Administered 2023-04-10: 125 ug/kg/min via INTRAVENOUS

## 2023-04-10 MED ORDER — LIDOCAINE HCL (CARDIAC) PF 100 MG/5ML IV SOSY
PREFILLED_SYRINGE | INTRAVENOUS | Status: DC | PRN
  Administered 2023-04-10: 50 mg via INTRAVENOUS

## 2023-04-10 NOTE — Anesthesia Preprocedure Evaluation (Addendum)
Anesthesia Evaluation  Patient identified by MRN, date of birth, ID band Patient awake    Reviewed: Allergy & Precautions, NPO status , Patient's Chart, lab work & pertinent test results  History of Anesthesia Complications Negative for: history of anesthetic complications  Airway Mallampati: III   Neck ROM: Full    Dental  (+) Upper Dentures, Partial Lower, Missing   Pulmonary neg pulmonary ROS   Pulmonary exam normal breath sounds clear to auscultation       Cardiovascular hypertension, Normal cardiovascular exam Rhythm:Regular Rate:Normal     Neuro/Psych negative neurological ROS     GI/Hepatic negative GI ROS,,,  Endo/Other  negative endocrine ROS    Renal/GU Renal disease (urolithiasis)   Prostate CA    Musculoskeletal   Abdominal   Peds  Hematology negative hematology ROS (+)   Anesthesia Other Findings   Reproductive/Obstetrics                             Anesthesia Physical Anesthesia Plan  ASA: 2  Anesthesia Plan: General   Post-op Pain Management:    Induction: Intravenous  PONV Risk Score and Plan: 2 and Propofol infusion, TIVA and Treatment may vary due to age or medical condition  Airway Management Planned: Natural Airway  Additional Equipment:   Intra-op Plan:   Post-operative Plan:   Informed Consent: I have reviewed the patients History and Physical, chart, labs and discussed the procedure including the risks, benefits and alternatives for the proposed anesthesia with the patient or authorized representative who has indicated his/her understanding and acceptance.       Plan Discussed with: CRNA  Anesthesia Plan Comments: (LMA/GETA backup discussed.  Patient consented for risks of anesthesia including but not limited to:  - adverse reactions to medications - damage to eyes, teeth, lips or other oral mucosa - nerve damage due to positioning  - sore  throat or hoarseness - damage to heart, brain, nerves, lungs, other parts of body or loss of life  Informed patient about role of CRNA in peri- and intra-operative care.  Patient voiced understanding.)        Anesthesia Quick Evaluation

## 2023-04-10 NOTE — Anesthesia Procedure Notes (Signed)
Procedure Name: MAC Date/Time: 04/10/2023 11:49 AM  Performed by: Ginger Carne, CRNAPre-anesthesia Checklist: Patient identified, Emergency Drugs available, Suction available and Patient being monitored Patient Re-evaluated:Patient Re-evaluated prior to induction Oxygen Delivery Method: Nasal cannula

## 2023-04-10 NOTE — H&P (Signed)
Arlyss Repress, MD [address]  [address]  [place], [place] [address]  Main: [phone]  Fax: [phone] Pager: [phone]  Primary Care Physician:  Leanna Sato, MD Primary Gastroenterologist:  Dr. Arlyss Repress  Pre-Procedure History & Physical: HPI:  Deacon Nicolosi is a 82 y.o. male is here for an colonoscopy.   Past Medical History:  Diagnosis Date   Acute appendicitis    perforated   Anal fistula    Bladder stones    Diverticulitis    Elevated PSA 07/19/2015   Hemorrhoids, internal    Hydrocele, bilateral 07/19/2015   Hypertension    Inguinal hernia    Prostate cancer (HCC)    Ventral hernia without obstruction or gangrene 05/24/2015    Past Surgical History:  Procedure Laterality Date   APPENDECTOMY     colonoscopy 2010     CYSTOSCOPY WITH INSERTION OF UROLIFT N/A 03/07/2020   Procedure: CYSTOSCOPY WITH INSERTION OF UROLIFT;  Surgeon: Vanna Scotland, MD;  Location: [facility] ORS;  Service: Urology;  Laterality: N/A;   CYSTOSCOPY WITH LITHOLAPAXY N/A 03/07/2020   Procedure: CYSTOSCOPY WITH LITHOLAPAXY;  Surgeon: Vanna Scotland, MD;  Location: [facility] OR[facility];  Service: Urology;  Laterality: N/A;   HERNIA REPAIR  02/04/2012   Dr. Blanca Friend HERNIA REPAIR Left 05/06/2019   Procedure: HERNIA REPAIR INGUINAL ADULT OPEN WITH MESH, LEFT;  Surgeon: Carolan Shiver, MD;  Location: [facility] ORS;  Service: General;  Laterality: Left;   INGUINAL HERNIA REPAIR  2013   Dr. Excell Seltzer   LAPAROSCOPIC APPENDECTOMY  07/03/2011   Dr Excell Seltzer   RECTAL SURGERY  08/08/2009   Dr. Michela Pitcher    Prior to Admission medications   Medication Sig Start Date End Date Taking? Authorizing Provider  amLODipine (NORVASC) 5 MG tablet Take 5 mg by mouth daily. 09/16/20  Yes [provider]  hydrochlorothiazide (HYDRODIURIL) 25 MG tablet Take 25 mg by mouth at bedtime.  05/03/15  Yes [provider]  levothyroxine (SYNTHROID) 25 MCG tablet Take 25 mcg by mouth daily. 11/09/22  Yes  [provider]  lovastatin (MEVACOR) 40 MG tablet Take 80 mg by mouth at bedtime.    Yes [provider]  metFORMIN (GLUCOPHAGE) 500 MG tablet Take 500 mg by mouth daily with breakfast.   Yes [provider]  tamsulosin (FLOMAX) 0.4 MG CAPS capsule Take 1 capsule (0.4 mg total) by mouth daily. 12/11/22  Yes Vanna Scotland, MD    Allergies as of 03/20/2023 - Review Complete 03/20/2023  Allergen Reaction Noted   Clindamycin/lincomycin Rash 05/20/2015    Family History  Problem Relation Age of Onset   Hypertension Mother    Cancer Father        prostate   Hypertension Father    Kidney disease Neg Hx    Kidney cancer Neg Hx    Bladder Cancer Neg Hx     Social History   Socioeconomic History   Marital status: Widowed    Spouse name: Not on file   Number of children: Not on file   Years of education: Not on file   Highest education level: Not on file  Occupational History   Not on file  Tobacco Use   Smoking status: Never   Smokeless tobacco: Never  Vaping Use   Vaping Use: Never used  Substance and Sexual Activity   Alcohol use: Yes    Comment: occassional   Drug use: No   Sexual activity: Not on file  Other Topics Concern  Not on file  Social History Narrative   Not on file   Social Determinants of Health   Financial Resource Strain: Not on file  Food Insecurity: Not on file  Transportation Needs: Not on file  Physical Activity: Not on file  Stress: Not on file  Social Connections: Not on file  Intimate Partner Violence: Not on file    Review of Systems: See HPI, otherwise negative ROS  Physical Exam: BP 138/81   Pulse 82   Temp (!) 96.4 F (35.8 C) (Temporal)   Resp 16   Wt 79.8 kg   SpO2 100%   BMI 26.76 kg/m  General:   Alert,  pleasant and cooperative in NAD Head:  Normocephalic and atraumatic. Neck:  Supple; no masses or thyromegaly. Lungs:  Clear throughout to auscultation.    Heart:  Regular rate and  rhythm. Abdomen:  Soft, nontender and nondistended. Normal bowel sounds, without guarding, and without rebound.   Neurologic:  Alert and  oriented x4;  grossly normal neurologically.  Impression/Plan: Lealon Skillings is here for an colonoscopy to be performed for h/o colon adenomas  Risks, benefits, limitations, and alternatives regarding  colonoscopy have been reviewed with the patient.  Questions have been answered.  All parties agreeable.   Lannette Donath, MD  04/10/2023, 11:41 AM

## 2023-04-10 NOTE — Transfer of Care (Signed)
Immediate Anesthesia Transfer of Care Note  Patient: [name]  Procedure(s) Performed: COLONOSCOPY WITH PROPOFOL  Patient Location:  Endoscopy Unit Anesthesia Type:General  Level of Consciousness: sedated and unresponsive  Airway & Oxygen Therapy: Patient Spontanous Breathing  Post-op Assessment: Report given to RN and Post -op Vital signs reviewed and stable  Post vital signs: Reviewed and stable  Last Vitals:  Vitals Value Taken Time  BP 99/61 04/10/23 1222  Temp    Pulse 72 04/10/23 1222  Resp 17 04/10/23 1222  SpO2 96 % 04/10/23 1222    Last Pain:  Vitals:   04/10/23 1055  TempSrc: Temporal  PainSc: 0-No pain         Complications: No notable events documented.

## 2023-04-10 NOTE — Anesthesia Postprocedure Evaluation (Signed)
Anesthesia Post Note  Patient: [name]  Procedure(s) Performed: COLONOSCOPY WITH PROPOFOL  Patient location during evaluation: PACU Anesthesia Type: General Level of consciousness: awake and alert, oriented and patient cooperative Pain management: pain level controlled Vital Signs Assessment: post-procedure vital signs reviewed and stable Respiratory status: spontaneous breathing, nonlabored ventilation and respiratory function stable Cardiovascular status: blood pressure returned to baseline and stable Postop Assessment: adequate PO intake Anesthetic complications: no   No notable events documented.   Last Vitals:  Vitals:   04/10/23 1222 04/10/23 1223  BP: 99/61 122/72  Pulse: 72 72  Resp: 17 17  Temp:    SpO2: 96% 96%    Last Pain:  Vitals:   04/10/23 1223  TempSrc:   PainSc: 0-No pain                 Reed Breech

## 2023-04-10 NOTE — Op Note (Signed)
[facility] Gastroenterology Patient Name: [name] Procedure Date: 04/10/2023 11:37 AM MRN: [id number] Account #: [id number] Date of Birth: 1940-11-22 Admit Type: Outpatient Age: 82 Room: [facility] ENDO ROOM 2 Gender: Male Note Status: Finalized Instrument Name: Peds Colonoscope [id number] Procedure:             Colonoscopy Indications:           Surveillance: Personal history of adenomatous polyps                         on last colonoscopy 5 years ago, Last colonoscopy:                         November 2015 Providers:             Toney Reil MD, MD Medicines:             General Anesthesia Complications:         No immediate complications. Estimated blood loss: None. Procedure:             Pre-Anesthesia Assessment:                        - Prior to the procedure, a History and Physical was                         performed, and patient medications and allergies were                         reviewed. The patient is competent. The risks and                         benefits of the procedure and the sedation options and                         risks were discussed with the patient. All questions                         were answered and informed consent was obtained.                         Patient identification and proposed procedure were                         verified by the physician, the nurse, the                         anesthesiologist, the anesthetist and the technician                         in the pre-procedure area in the procedure room in the                         endoscopy suite. Mental Status Examination: alert and                         oriented. Airway Examination: normal oropharyngeal                         airway and neck  mobility. Respiratory Examination:                         clear to auscultation. CV Examination: normal.                         Prophylactic Antibiotics: The patient does not require                          prophylactic antibiotics. Prior Anticoagulants: The                         patient has taken no anticoagulant or antiplatelet                         agents. ASA Grade Assessment: II - A patient with mild                         systemic disease. After reviewing the risks and                         benefits, the patient was deemed in satisfactory                         condition to undergo the procedure. The anesthesia                         plan was to use general anesthesia. Immediately prior                         to administration of medications, the patient was                         re-assessed for adequacy to receive sedatives. The                         heart rate, respiratory rate, oxygen saturations,                         blood pressure, adequacy of pulmonary ventilation, and                         response to care were monitored throughout the                         procedure. The physical status of the patient was                         re-assessed after the procedure.                        After obtaining informed consent, the colonoscope was                         passed under direct vision. Throughout the procedure,                         the patient's blood pressure, pulse, and oxygen  saturations were monitored continuously. The                         Colonoscope was introduced through the anus and                         advanced to the the cecum, identified by appendiceal                         orifice and ileocecal valve. The colonoscopy was                         performed with moderate difficulty due to multiple                         diverticula in the colon, inadequate bowel prep and                         significant looping. Successful completion of the                         procedure was aided by applying abdominal pressure.                         The patient tolerated the procedure well. The quality                          of the bowel preparation was fair. The ileocecal                         valve, appendiceal orifice, and rectum were                         photographed. Findings:      The perianal and digital rectal examinations were normal. Pertinent       negatives include normal sphincter tone and no palpable rectal lesions.      A diminutive polyp was found in the cecum. The polyp was sessile. The       polyp was removed with a cold biopsy forceps. Resection and retrieval       were complete. Estimated blood loss: none.      Three sessile polyps were found in the ascending colon. The polyps were       3 to 4 mm in size. These polyps were removed with a cold snare.       Resection and retrieval were complete.      A 5 mm polyp was found in the transverse colon. The polyp was sessile.       The polyp was removed with a cold snare. Resection and retrieval were       complete. Estimated blood loss: none.      A 25 mm polyp was found in the descending colon. The polyp was       pedunculated. The polyp was removed with a hot snare. Resection and       retrieval were complete. Estimated blood loss: none. To prevent bleeding       after the polypectomy, one hemostatic clip was successfully placed (MR       safe). Clip manufacturer: [employer]. There was no bleeding  during, or at the end, of the procedure.      Scattered large-mouthed diverticula were found in the entire colon.       There was no evidence of diverticular bleeding.      Non-bleeding external hemorrhoids were found during retroflexion. The       hemorrhoids were medium-sized. Impression:            - Preparation of the colon was fair.                        - One diminutive polyp in the cecum, removed with a                         cold biopsy forceps. Resected and retrieved.                        - Three 3 to 4 mm polyps in the ascending colon,                         removed with a cold snare. Resected and retrieved.                         - One 5 mm polyp in the transverse colon, removed with                         a cold snare. Resected and retrieved.                        - One 25 mm polyp in the descending colon, removed                         with a hot snare. Resected and retrieved. Clip                         manufacturer: [employer]. Clip (MR safe) was                         placed.                        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                         There was no evidence of diverticular bleeding.                        - Non-bleeding external hemorrhoids. Recommendation:        - Discharge patient to home (with escort).                        - Resume previous diet today.                        - Continue present medications.                        - Await pathology results. Procedure Code(s):     --- Professional ---  19147, Colonoscopy, flexible; with removal of                         tumor(s), polyp(s), or other lesion(s) by snare                         technique                        45380, 59, Colonoscopy, flexible; with biopsy, single                         or multiple Diagnosis Code(s):     --- Professional ---                        Z86.010, Personal history of colonic polyps                        K64.4, Residual hemorrhoidal skin tags                        D12.0, Benign neoplasm of cecum                        D12.4, Benign neoplasm of descending colon                        D12.3, Benign neoplasm of transverse colon (hepatic                         flexure or splenic flexure)                        D12.2, Benign neoplasm of ascending colon                        K57.30, Diverticulosis of large intestine without                         perforation or abscess without bleeding CPT copyright 2022 American Medical Association. All rights reserved. The codes documented in this report are preliminary and upon coder  review may  be revised to meet current compliance requirements. Dr. Libby Maw Toney Reil MD, MD 04/10/2023 12:18:38 PM This report has been signed electronically. Number of Addenda: 0 Note Initiated On: 04/10/2023 11:37 AM Scope Withdrawal Time: 0 hours 17 minutes 48 seconds  Total Procedure Duration: 0 hours 22 minutes 21 seconds  Estimated Blood Loss:  Estimated blood loss: none.      [facility]

## 2023-04-11 ENCOUNTER — Encounter: Payer: Self-pay | Admitting: Gastroenterology

## 2023-12-09 ENCOUNTER — Other Ambulatory Visit: Payer: Medicare Other

## 2023-12-09 DIAGNOSIS — N401 Enlarged prostate with lower urinary tract symptoms: Secondary | ICD-10-CM

## 2023-12-10 ENCOUNTER — Ambulatory Visit: Payer: Medicare Other | Admitting: Urology

## 2023-12-10 ENCOUNTER — Encounter: Payer: Self-pay | Admitting: Urology

## 2023-12-10 VITALS — BP 145/70 | HR 94 | Ht 68.0 in | Wt 186.8 lb

## 2023-12-10 DIAGNOSIS — Z8546 Personal history of malignant neoplasm of prostate: Secondary | ICD-10-CM | POA: Diagnosis not present

## 2023-12-10 DIAGNOSIS — N401 Enlarged prostate with lower urinary tract symptoms: Secondary | ICD-10-CM | POA: Diagnosis not present

## 2023-12-10 DIAGNOSIS — N138 Other obstructive and reflux uropathy: Secondary | ICD-10-CM

## 2023-12-10 DIAGNOSIS — C61 Malignant neoplasm of prostate: Secondary | ICD-10-CM

## 2023-12-10 LAB — BLADDER SCAN AMB NON-IMAGING

## 2023-12-10 LAB — PSA: Prostate Specific Ag, Serum: 0.1 ng/mL (ref 0.0–4.0)

## 2023-12-10 MED ORDER — TAMSULOSIN HCL 0.4 MG PO CAPS: 0.4000 mg | ORAL_CAPSULE | Freq: Every day | ORAL | 3 refills | Status: DC

## 2023-12-10 NOTE — Progress Notes (Signed)
 I,Amy L Pierron,acting as a scribe for Rosina Riis, MD.,have documented all relevant documentation on the behalf of Rosina Riis, MD,as directed by  Rosina Riis, MD while in the presence of Rosina Riis, MD.  12/10/2023 2:04 PM   [name] 08-10-1941 [id number]  Referring provider: Buren Rock HERO, MD [address] [place],  [place] [address]  Chief Complaint  Patient presents with   Benign Prostatic Hypertrophy    HPI: 83 year-old male with a personal history of prostate cancer and BPH presents today for routine annual follow-up.  He underwent UroLift on 03/2020 as well as cystolitholapaxy prior to undergoing XRT + 6 months ADT for his prostate cancer (unfavorable intermediate risk). He was diagnosed with Gleason 4+3 up to 60% of the tissue at that time.    His most recent PSA from 12/09/2023 is stable at 0.1.  He has nocturia x1 most nights. Occasionally he gets up more. At one point he did try weaning off the Flomax  and symptoms worsened, so he started back taking it.  Results for orders placed or performed in visit on 12/10/23  BLADDER SCAN AMB NON-IMAGING  Result Value Ref Range   Scan Result 0ml     IPSS     Row Name 12/10/23 1300         International Prostate Symptom Score   How often have you had the sensation of not emptying your bladder? Less than 1 in 5     How often have you had to urinate less than every two hours? Less than 1 in 5 times     How often have you found you stopped and started again several times when you urinated? Less than 1 in 5 times     How often have you found it difficult to postpone urination? Less than 1 in 5 times     How often have you had a weak urinary stream? Less than 1 in 5 times     How often have you had to strain to start urination? Not at All     How many times did you typically get up at night to urinate? 3 Times     Total IPSS Score 8       Quality of Life due to urinary symptoms   If you were to spend the  rest of your life with your urinary condition just the way it is now how would you feel about that? Pleased            Score:  1-7 Mild 8-19 Moderate 20-35 Severe   PMH: Past Medical History:  Diagnosis Date   Acute appendicitis    perforated   Anal fistula    Bladder stones    Diverticulitis    Elevated PSA 07/19/2015   Hemorrhoids, internal    Hydrocele, bilateral 07/19/2015   Hypertension    Inguinal hernia    Prostate cancer (HCC)    Ventral hernia without obstruction or gangrene 05/24/2015    Surgical History: Past Surgical History:  Procedure Laterality Date   APPENDECTOMY     colonoscopy 2010     COLONOSCOPY WITH PROPOFOL  N/A 04/10/2023   Procedure: COLONOSCOPY WITH PROPOFOL ;  Surgeon: Unk Corinn Skiff, MD;  Location: [facility] END[facility]COPY;  Service: Gastroenterology;  Laterality: N/A;   CYSTOSCOPY WITH INSERTION OF UROLIFT N/A 03/07/2020   Procedure: CYSTOSCOPY WITH INSERTION OF UROLIFT;  Surgeon: Riis Rosina, MD;  Location: [facility] ORS;  Service: Urology;  Laterality: N/A;   CYSTOSCOPY WITH LITHOLAPAXY N/A  03/07/2020   Procedure: CYSTOSCOPY WITH LITHOLAPAXY;  Surgeon: Penne Knee, MD;  Location: [facility] OR[facility];  Service: Urology;  Laterality: N/A;   HERNIA REPAIR  02/04/2012   Dr. Wonda FONTANA HERNIA REPAIR Left 05/06/2019   Procedure: HERNIA REPAIR INGUINAL ADULT OPEN WITH MESH, LEFT;  Surgeon: Rodolph Romano, MD;  Location: [facility] ORS;  Service: General;  Laterality: Left;   INGUINAL HERNIA REPAIR  2013   Dr. Wonda   LAPAROSCOPIC APPENDECTOMY  07/03/2011   Dr Wonda   RECTAL SURGERY  08/08/2009   Dr. Lorrene    Home Medications:  Allergies as of 12/10/2023       Reactions   Clindamycin/lincomycin Rash   Tachycardia        Medication List        Accurate as of December 10, 2023  2:04 PM. If you have any questions, ask your nurse or doctor.          amLODipine 5 MG tablet Commonly known as: NORVASC Take 5 mg by mouth daily.    hydrochlorothiazide  25 MG tablet Commonly known as: HYDRODIURIL  Take 25 mg by mouth at bedtime.   levothyroxine 25 MCG tablet Commonly known as: SYNTHROID Take 25 mcg by mouth daily.   lovastatin 40 MG tablet Commonly known as: MEVACOR Take 80 mg by mouth at bedtime.   metFORMIN 500 MG tablet Commonly known as: GLUCOPHAGE Take 500 mg by mouth daily with breakfast.   tamsulosin  0.4 MG Caps capsule Commonly known as: FLOMAX  Take 1 capsule (0.4 mg total) by mouth daily.        Allergies:  Allergies  Allergen Reactions   Clindamycin/Lincomycin Rash    Tachycardia     Family History: Family History  Problem Relation Age of Onset   Hypertension Mother    Cancer Father        prostate   Hypertension Father    Kidney disease Neg Hx    Kidney cancer Neg Hx    Bladder Cancer Neg Hx     Social History:  reports that he has never smoked. He has never used smokeless tobacco. He reports current alcohol use. He reports that he does not use drugs.   Physical Exam: BP (!) 145/70   Pulse 94   Ht 5' 8 (1.727 m)   Wt 186 lb 12.8 oz (84.7 kg)   BMI 28.40 kg/m   Constitutional:  Alert and oriented, No acute distress. HEENT: [place] AT, moist mucus membranes.  Trachea midline, no masses. Neurologic: Grossly intact, no focal deficits, moving all 4 extremities. Psychiatric: Normal mood and affect.   Assessment & Plan:    1. Prostate cancer  - No evidence of disease. Continue to monitor PSA annually.  2. BPH  - Status post UroLift.   -  He continues on Flomax . Attempted to wean off as previously discussed with adverse results. Started back on it and his urinary symptoms are fairly stable.   Return in about 1 year (around 12/09/2024) for PSA, IPSS, PVR.  I have reviewed the above documentation for accuracy and completeness, and I agree with the above.   Knee Penne, MD   [facility] [address], [address] [place], [place] [address] [phone]

## 2024-06-30 ENCOUNTER — Encounter: Payer: Self-pay | Admitting: Urology

## 2024-11-19 ENCOUNTER — Other Ambulatory Visit: Payer: Self-pay

## 2024-11-19 DIAGNOSIS — N401 Enlarged prostate with lower urinary tract symptoms: Secondary | ICD-10-CM

## 2024-12-03 ENCOUNTER — Other Ambulatory Visit: Payer: Medicare Other

## 2024-12-04 ENCOUNTER — Ambulatory Visit: Admitting: Urology

## 2024-12-07 ENCOUNTER — Other Ambulatory Visit

## 2024-12-08 ENCOUNTER — Ambulatory Visit: Payer: Medicare Other | Admitting: Urology

## 2024-12-08 ENCOUNTER — Telehealth: Payer: Self-pay

## 2024-12-08 MED ORDER — TAMSULOSIN HCL 0.4 MG PO CAPS: 0.4000 mg | ORAL_CAPSULE | Freq: Every day | ORAL | 3 refills | Status: AC

## 2024-12-08 NOTE — Telephone Encounter (Signed)
 Called patient to let him know I refilled his Flomax  0.4mg  medication 90 tablets with 3 refills to [facility]. Patient was thankful of the call and had no further questions or concerns.-Marlia Schewe,CMA

## 2024-12-08 NOTE — Addendum Note (Signed)
 Addended by: Aalivia Mcgraw E on: 12/08/2024 03:27 PM   Modules accepted: Orders

## 2024-12-09 DIAGNOSIS — C61 Malignant neoplasm of prostate: Secondary | ICD-10-CM | POA: Insufficient documentation

## 2024-12-09 NOTE — Progress Notes (Unsigned)
" ° °  12/16/2024 1:08 PM   [name] 1941/09/18 [id number]  Reason for visit: Follow up prostate Ca, BPH   HPI: 84 y.o. male, initial follow up with me today, previously seen by Dr. Penne in Feb 2025  Prior HPI: Hx of intermediate-risk prostate Ca (dx 2021)  - s/p IMRT + 6 mo ADT  Hx of BPH  - s/p Urolift in 2021  - s/p prior cystolithalopaxy    Physical Exam: There were no vitals taken for this visit.   Constitutional:  Alert and oriented, No acute distress. GU: ***  Laboratory Data: Component Ref Range & Units (hover) 1 yr ago (12/09/23) 2 yr ago (12/07/22) 4 yr ago (10/10/20) 4 yr ago (02/02/20) 5 yr ago (08/11/19) 7 yr ago (11/27/17)  Prostate Specific Ag, Serum 0.1 0.1 CM <0.1 CM 17.7 High  CM 11.6 High  CM 7.3 High  CM    Pertinent Imaging: N/A    Assessment & Plan:    BPH with obstruction/lower urinary tract symptoms Assessment & Plan: Hx of BPH  - ~43g via MRI   - s/p Urolift in 2021  - s/p prior cystolithalopaxy   Prostate cancer [facility]) Assessment & Plan: Hx of intermediate-risk prostate Ca (dx 2021)  - s/p IMRT + 6 mo ADT  Undetectable PSAs since treatment. Overall doing well.   I am fine with continuing annual PSA, although we may certainly consider discontinuation in the near future. I doubt this disease will affect him in his lifetime.         Penne JONELLE Skye, MD  [facility] [address], [address] [place], [place] [address] [phone] "

## 2024-12-09 NOTE — Assessment & Plan Note (Signed)
 Hx of BPH  - ~43g via MRI   - s/p Urolift in 2021  - s/p prior cystolithalopaxy

## 2024-12-09 NOTE — Assessment & Plan Note (Signed)
 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 Overall doing well.   I am fine with continuing annual PSA, although we may certainly consider discontinuation in the near future. I doubt this disease will affect him in his lifetime.

## 2024-12-14 ENCOUNTER — Other Ambulatory Visit

## 2024-12-16 ENCOUNTER — Ambulatory Visit: Admitting: Urology

## 2024-12-16 DIAGNOSIS — N138 Other obstructive and reflux uropathy: Secondary | ICD-10-CM

## 2024-12-16 DIAGNOSIS — C61 Malignant neoplasm of prostate: Secondary | ICD-10-CM
